# Patient Record
Sex: Female | Born: 1965 | Race: White | Hispanic: No | Marital: Married | Smoking: Never smoker
Health system: Southern US, Community
[De-identification: ages and names within clinical notes are randomized; demographics above are authoritative.]

## PROBLEM LIST (undated history)

## (undated) DIAGNOSIS — E669 Obesity, unspecified: Secondary | ICD-10-CM

## (undated) DIAGNOSIS — R011 Cardiac murmur, unspecified: Secondary | ICD-10-CM

## (undated) DIAGNOSIS — E785 Hyperlipidemia, unspecified: Secondary | ICD-10-CM

## (undated) DIAGNOSIS — R519 Headache, unspecified: Secondary | ICD-10-CM

## (undated) DIAGNOSIS — N819 Female genital prolapse, unspecified: Secondary | ICD-10-CM

## (undated) DIAGNOSIS — E119 Type 2 diabetes mellitus without complications: Secondary | ICD-10-CM

## (undated) DIAGNOSIS — R51 Headache: Secondary | ICD-10-CM

## (undated) DIAGNOSIS — I10 Essential (primary) hypertension: Secondary | ICD-10-CM

## (undated) DIAGNOSIS — K429 Umbilical hernia without obstruction or gangrene: Secondary | ICD-10-CM

## (undated) HISTORY — DX: Hyperlipidemia, unspecified: E78.5

## (undated) HISTORY — DX: Cardiac murmur, unspecified: R01.1

## (undated) HISTORY — DX: Type 2 diabetes mellitus without complications: E11.9

## (undated) HISTORY — DX: Obesity, unspecified: E66.9

## (undated) HISTORY — PX: CHOLECYSTECTOMY: SHX55

## (undated) HISTORY — DX: Female genital prolapse, unspecified: N81.9

---

## 1997-04-21 ENCOUNTER — Encounter: Admission: RE | Admit: 1997-04-21 | Discharge: 1997-07-20 | Payer: Self-pay | Admitting: Obstetrics and Gynecology

## 1998-02-26 ENCOUNTER — Emergency Department (HOSPITAL_COMMUNITY): Admission: EM | Admit: 1998-02-26 | Discharge: 1998-02-26 | Payer: Self-pay | Admitting: Emergency Medicine

## 1998-02-26 ENCOUNTER — Encounter: Payer: Self-pay | Admitting: Emergency Medicine

## 1998-11-20 ENCOUNTER — Other Ambulatory Visit: Admission: RE | Admit: 1998-11-20 | Discharge: 1998-11-20 | Payer: Self-pay | Admitting: Obstetrics and Gynecology

## 1999-02-18 ENCOUNTER — Ambulatory Visit (HOSPITAL_COMMUNITY): Admission: RE | Admit: 1999-02-18 | Discharge: 1999-02-18 | Payer: Self-pay | Admitting: *Deleted

## 1999-02-18 ENCOUNTER — Encounter: Payer: Self-pay | Admitting: *Deleted

## 1999-03-11 ENCOUNTER — Encounter: Admission: RE | Admit: 1999-03-11 | Discharge: 1999-05-10 | Payer: Self-pay | Admitting: Obstetrics and Gynecology

## 1999-03-19 ENCOUNTER — Encounter: Payer: Self-pay | Admitting: Obstetrics and Gynecology

## 1999-03-19 ENCOUNTER — Ambulatory Visit (HOSPITAL_COMMUNITY): Admission: RE | Admit: 1999-03-19 | Discharge: 1999-03-19 | Payer: Self-pay | Admitting: Obstetrics and Gynecology

## 1999-05-03 ENCOUNTER — Inpatient Hospital Stay (HOSPITAL_COMMUNITY): Admission: AD | Admit: 1999-05-03 | Discharge: 1999-05-06 | Payer: Self-pay | Admitting: Obstetrics and Gynecology

## 1999-05-03 ENCOUNTER — Ambulatory Visit (HOSPITAL_COMMUNITY): Admission: RE | Admit: 1999-05-03 | Discharge: 1999-05-03 | Payer: Self-pay | Admitting: Obstetrics and Gynecology

## 1999-05-07 ENCOUNTER — Encounter: Admission: RE | Admit: 1999-05-07 | Discharge: 1999-06-07 | Payer: Self-pay | Admitting: Obstetrics and Gynecology

## 1999-06-09 ENCOUNTER — Other Ambulatory Visit: Admission: RE | Admit: 1999-06-09 | Discharge: 1999-06-09 | Payer: Self-pay | Admitting: Obstetrics and Gynecology

## 1999-08-06 ENCOUNTER — Encounter: Payer: Self-pay | Admitting: Emergency Medicine

## 1999-08-06 ENCOUNTER — Inpatient Hospital Stay (HOSPITAL_COMMUNITY): Admission: EM | Admit: 1999-08-06 | Discharge: 1999-08-07 | Payer: Self-pay | Admitting: Emergency Medicine

## 1999-08-06 ENCOUNTER — Encounter: Payer: Self-pay | Admitting: Surgery

## 2000-12-12 ENCOUNTER — Other Ambulatory Visit: Admission: RE | Admit: 2000-12-12 | Discharge: 2000-12-12 | Payer: Self-pay | Admitting: Obstetrics and Gynecology

## 2002-01-22 ENCOUNTER — Other Ambulatory Visit: Admission: RE | Admit: 2002-01-22 | Discharge: 2002-01-22 | Payer: Self-pay | Admitting: Obstetrics and Gynecology

## 2002-06-17 ENCOUNTER — Encounter: Payer: Self-pay | Admitting: Obstetrics and Gynecology

## 2002-06-17 ENCOUNTER — Inpatient Hospital Stay (HOSPITAL_COMMUNITY): Admission: AD | Admit: 2002-06-17 | Discharge: 2002-06-17 | Payer: Self-pay | Admitting: Obstetrics and Gynecology

## 2002-06-24 ENCOUNTER — Inpatient Hospital Stay (HOSPITAL_COMMUNITY): Admission: AD | Admit: 2002-06-24 | Discharge: 2002-06-27 | Payer: Self-pay | Admitting: Obstetrics and Gynecology

## 2002-08-06 ENCOUNTER — Other Ambulatory Visit: Admission: RE | Admit: 2002-08-06 | Discharge: 2002-08-06 | Payer: Self-pay | Admitting: Obstetrics and Gynecology

## 2013-09-19 ENCOUNTER — Encounter: Payer: BC Managed Care – PPO | Attending: Internal Medicine

## 2013-09-19 VITALS — Ht 69.5 in | Wt 225.0 lb

## 2013-09-19 DIAGNOSIS — E119 Type 2 diabetes mellitus without complications: Secondary | ICD-10-CM | POA: Insufficient documentation

## 2013-09-19 DIAGNOSIS — Z713 Dietary counseling and surveillance: Secondary | ICD-10-CM | POA: Insufficient documentation

## 2013-09-20 NOTE — Progress Notes (Signed)
Patient was seen on 09/19/13 for the first of a series of three diabetes self-management courses at the [facility].  Current HbA1c: 11.6%  The following learning objectives were met by the patient during this class:  Describe diabetes  State some common risk factors for diabetes  Defines the role of glucose and insulin  Identifies type of diabetes and pathophysiology  Describe the relationship between diabetes and cardiovascular risk  State the members of the Healthcare Team  States the rationale for glucose monitoring  State when to test glucose  State their individual Target Range  State the importance of logging glucose readings  Describe how to interpret glucose readings  Identifies A1C target  Explain the correlation between A1c and eAG values  State symptoms and treatment of high blood glucose  State symptoms and treatment of low blood glucose  Explain proper technique for glucose testing  Identifies proper sharps disposal  Handouts given during class include:  Living Well with Diabetes book  Carb Counting and Meal Planning book  Meal Plan Card  Carbohydrate guide  Meal planning worksheet  Low Sodium Flavoring Tips  The diabetes portion plate  H7G to eAG Conversion Chart  Diabetes Medications  Diabetes Recommended Care Schedule  Support Group  Diabetes Success Plan  Core Class Satisfaction Survey  Follow-Up Plan:  Attend core 2

## 2013-09-26 DIAGNOSIS — E119 Type 2 diabetes mellitus without complications: Secondary | ICD-10-CM

## 2013-09-26 NOTE — Progress Notes (Signed)

## 2013-10-03 DIAGNOSIS — E119 Type 2 diabetes mellitus without complications: Secondary | ICD-10-CM | POA: Diagnosis not present

## 2013-10-03 NOTE — Progress Notes (Signed)
Patient was seen on 10/03/13 for the third of a series of three diabetes self-management courses at the [facility]. The following learning objectives were met by the patient during this class:    State the amount of activity recommended for healthy living   Describe activities suitable for individual needs   Identify ways to regularly incorporate activity into daily life   Identify barriers to activity and ways to over come these barriers  Identify diabetes medications being personally used and their primary action for lowering glucose and possible side effects   Describe role of stress on blood glucose and develop strategies to address psychosocial issues   Identify diabetes complications and ways to prevent them  Explain how to manage diabetes during illness   Evaluate success in meeting personal goal   Establish 2-3 goals that they will plan to diligently work on until they return for the  79-monthfollow-up visit  Goals:  Follow Diabetes Meal Plan as instructed  Aim for 15-30 mins of physical activity daily as tolerated  Bring food record and glucose log to your follow up visit  Your patient has established the following 4 month goals in their individualized success plan: I will count my carb choices at most meals and snacks  I will reduce my fat intake to reduce risk of heart disease  Your patient has identified these potential barriers to change:  Motivation  Your patient has identified their diabetes self-care support plan as  [facility]  Family Support  Plan:  Attend Core 4 in 4 months

## 2013-10-07 ENCOUNTER — Other Ambulatory Visit (HOSPITAL_COMMUNITY)
Admission: RE | Admit: 2013-10-07 | Discharge: 2013-10-07 | Disposition: A | Discharge status: Home / Self Care | Payer: BC Managed Care – PPO | Source: Ambulatory Visit | Attending: Internal Medicine | Admitting: Internal Medicine

## 2013-10-07 ENCOUNTER — Other Ambulatory Visit: Payer: Self-pay | Admitting: Internal Medicine

## 2013-10-07 DIAGNOSIS — Z1151 Encounter for screening for human papillomavirus (HPV): Secondary | ICD-10-CM | POA: Insufficient documentation

## 2013-10-07 DIAGNOSIS — Z01419 Encounter for gynecological examination (general) (routine) without abnormal findings: Secondary | ICD-10-CM | POA: Insufficient documentation

## 2013-10-09 LAB — CYTOLOGY - PAP

## 2014-01-20 ENCOUNTER — Encounter: Payer: BC Managed Care – PPO | Attending: Internal Medicine

## 2014-01-20 DIAGNOSIS — E119 Type 2 diabetes mellitus without complications: Secondary | ICD-10-CM | POA: Diagnosis present

## 2014-01-20 DIAGNOSIS — Z713 Dietary counseling and surveillance: Secondary | ICD-10-CM | POA: Insufficient documentation

## 2014-01-20 NOTE — Progress Notes (Signed)
Appt start time: 0900 end time:  1000.  Patient was seen on 01/20/14 for a review of the series of three diabetes self-management courses at the [facility]. The following learning objectives were met by the patient during this class:  . Reviewed blood glucose monitoring and interpretation including the recommended target ranges and Hgb A1c.  . Reviewed on carb counting, importance of regularly scheduled meals/snacks, and meal planning.  . Reviewed the effects of physical activity on glucose levels and long-term glucose control.  Recommended goal of 150 minutes of physical activity/week. . Reviewed patient medications and discussed role of medication on blood glucose and possible side effects. . Discussed strategies to manage stress, psychosocial issues, and other obstacles to diabetes management. . Encouraged moderate weight reduction to improve glucose levels.   . Reviewed short-term complications: hyper- and hypo-glycemia.  Discussed causes, symptoms, and treatment options. . Reviewed prevention, detection, and treatment of long-term complications.  Discussed the role of prolonged elevated glucose levels on body systems.  Goals:  Follow Diabetes Meal Plan as instructed  Eat 3 meals and 2 snacks, every 3-5 hrs  Limit carbohydrate intake to 45 grams carbohydrate/meal Limit carbohydrate intake to 15 grams carbohydrate/snack Add lean protein foods to meals/snacks  Monitor glucose levels as instructed by your doctor  Aim for goal of 15-30 mins of physical activity daily as tolerated  Bring food record and glucose log to your next nutrition visit

## 2014-08-04 ENCOUNTER — Ambulatory Visit (HOSPITAL_COMMUNITY): Payer: BLUE CROSS/BLUE SHIELD | Attending: Internal Medicine

## 2014-08-04 ENCOUNTER — Other Ambulatory Visit (HOSPITAL_COMMUNITY): Payer: Self-pay | Admitting: Internal Medicine

## 2014-08-04 ENCOUNTER — Other Ambulatory Visit: Payer: Self-pay

## 2014-08-04 DIAGNOSIS — R011 Cardiac murmur, unspecified: Secondary | ICD-10-CM

## 2014-12-17 ENCOUNTER — Other Ambulatory Visit: Payer: Self-pay

## 2014-12-17 ENCOUNTER — Emergency Department (HOSPITAL_COMMUNITY)
Admission: EM | Admit: 2014-12-17 | Discharge: 2014-12-17 | Disposition: A | Discharge status: Home / Self Care | Payer: BLUE CROSS/BLUE SHIELD | Attending: Emergency Medicine | Admitting: Emergency Medicine

## 2014-12-17 ENCOUNTER — Encounter (HOSPITAL_COMMUNITY): Payer: Self-pay | Admitting: *Deleted

## 2014-12-17 DIAGNOSIS — Y9289 Other specified places as the place of occurrence of the external cause: Secondary | ICD-10-CM | POA: Insufficient documentation

## 2014-12-17 DIAGNOSIS — Z91040 Latex allergy status: Secondary | ICD-10-CM | POA: Diagnosis not present

## 2014-12-17 DIAGNOSIS — Y9389 Activity, other specified: Secondary | ICD-10-CM | POA: Diagnosis not present

## 2014-12-17 DIAGNOSIS — E119 Type 2 diabetes mellitus without complications: Secondary | ICD-10-CM | POA: Insufficient documentation

## 2014-12-17 DIAGNOSIS — I10 Essential (primary) hypertension: Secondary | ICD-10-CM | POA: Insufficient documentation

## 2014-12-17 DIAGNOSIS — Y998 Other external cause status: Secondary | ICD-10-CM | POA: Diagnosis not present

## 2014-12-17 DIAGNOSIS — Z79899 Other long term (current) drug therapy: Secondary | ICD-10-CM | POA: Diagnosis not present

## 2014-12-17 DIAGNOSIS — W260XXA Contact with knife, initial encounter: Secondary | ICD-10-CM | POA: Insufficient documentation

## 2014-12-17 DIAGNOSIS — Z1231 Encounter for screening mammogram for malignant neoplasm of breast: Secondary | ICD-10-CM

## 2014-12-17 DIAGNOSIS — E669 Obesity, unspecified: Secondary | ICD-10-CM | POA: Insufficient documentation

## 2014-12-17 DIAGNOSIS — S61012A Laceration without foreign body of left thumb without damage to nail, initial encounter: Secondary | ICD-10-CM | POA: Diagnosis present

## 2014-12-17 HISTORY — DX: Essential (primary) hypertension: I10

## 2014-12-17 MED ORDER — LIDOCAINE HCL (PF) 1 % IJ SOLN
30.0000 mL | Freq: Once | INTRAMUSCULAR | Status: AC
  Administered 2014-12-17: 30 mL
  Filled 2014-12-17: qty 30

## 2014-12-17 MED ORDER — CEPHALEXIN 500 MG PO CAPS: 500.0000 mg | ORAL_CAPSULE | Freq: Four times a day (QID) | ORAL | Status: DC

## 2014-12-17 MED ORDER — TETANUS-DIPHTH-ACELL PERTUSSIS 5-2.5-18.5 LF-MCG/0.5 IM SUSP
0.5000 mL | Freq: Once | INTRAMUSCULAR | Status: DC
  Filled 2014-12-17: qty 0.5

## 2014-12-17 NOTE — ED Provider Notes (Signed)
CSN: [id number]     Arrival date & time 12/17/14  5726 History  By signing my name below, I, Evelene Croon, attest that this documentation has been prepared under the direction and in the presence of non-physician practitioner, Abigail Butts, PA-C. Electronically Signed: Evelene Croon, Scribe. 12/17/2014. 11:45 AM.   Chief Complaint  Patient presents with  . Finger Injury    The history is provided by the patient and medical records. No language interpreter was used.    HPI Comments:  Kristine Callahan is a 49 y.o. female with a HX of NIDDM presents to the Emergency Department complaining of a laceration to her left thumb, sustained ~ 0830 this AM. She reports associated throbbing pain to the thumb. Pt states she cut off the tip of her thumb with a henckel knife while cutting vegetables. Bleeding controlled with pressure. No associated symptoms noted. Her tetanus is UTD within the last year.   Past Medical History  Diagnosis Date  . Diabetes mellitus without complication   . Obesity   . Hypertension    Past Surgical History  Procedure Laterality Date  . Cholecystectomy     History reviewed. No pertinent family history. Social History  Substance Use Topics  . Smoking status: Never Smoker   . Smokeless tobacco: None  . Alcohol Use: No   OB History    No data available     Review of Systems  Constitutional: Negative for fever and chills.  Gastrointestinal: Negative for nausea and vomiting.  Skin: Positive for wound (Left Thumb).  Allergic/Immunologic: Negative for immunocompromised state.  Neurological: Negative for weakness and numbness.  Hematological: Does not bruise/bleed easily.  Psychiatric/Behavioral: The patient is not nervous/anxious.     Allergies  Latex  Home Medications   Prior to Admission medications   Medication Sig Start Date End Date Taking? Authorizing Provider  cephALEXin (KEFLEX) 500 MG capsule Take 1 capsule (500 mg total) by mouth 4 (four) times  daily. 12/17/14   Merin Borjon, PA-C  sitaGLIPtin-metformin (JANUMET) 50-1000 MG per tablet Take 1 tablet by mouth 2 (two) times daily with a meal.    Historical Provider, MD   BP 162/92 mmHg  Pulse 86  Temp(Src) 98.5 F (36.9 C) (Oral)  Resp 17  Ht 5\' 10"  (1.778 m)  Wt 235 lb (106.595 kg)  BMI 33.72 kg/m2  SpO2 98% Physical Exam  Constitutional: She is oriented to person, place, and time. She appears well-developed and well-nourished. No distress.  HENT:  Head: Normocephalic and atraumatic.  Eyes: Conjunctivae are normal. No scleral icterus.  Neck: Normal range of motion.  Cardiovascular: Normal rate, regular rhythm, normal heart sounds and intact distal pulses.   No murmur heard. Capillary refill < 3 sec  Pulmonary/Chest: Effort normal and breath sounds normal. No respiratory distress.  Musculoskeletal: Normal range of motion. She exhibits no edema.  FROM: left thumb   Neurological: She is alert and oriented to person, place, and time.  Sensation: intact Strength: 5/5 with flexion/extenstion of the right thumb  Skin: Skin is warm and dry. She is not diaphoretic.  3cm avulsion to the tip of the left thumb   Psychiatric: She has a normal mood and affect.  Nursing note and vitals reviewed.   ED Course  Procedures   DIAGNOSTIC STUDIES:  Oxygen Saturation is 98% on RA, normal by my interpretation.    COORDINATION OF CARE:  11:26 AM Discussed treatment plan with pt at bedside and pt agreed to plan.  LACERATION REPAIR PROCEDURE NOTE  The patient's identification was confirmed and consent was obtained. This procedure was performed by Abigail Butts, PA-C, at 12:31 PM. Site: left thumb Sterile procedures observed Anesthetic used (type and amt): 6cc of 1% lidocaine without epi as a digital block Suture type/size: 5-0prolene Length:3cm # of Sutures: 5 Technique: simple ionterrupted Complex Tetanus UTD  Site anesthetized, irrigated with NS, explored without  evidence of foreign body, wound well approximated, site covered with dry, sterile dressing.  Patient tolerated procedure well without complications. Instructions for care discussed verbally and patient provided with additional written instructions for homecare and f/u.   Labs Review Labs Reviewed - No data to display  Imaging Review No results found. I have personally reviewed and evaluated these images and lab results as part of my medical decision-making.   EKG Interpretation None      MDM   Final diagnoses:  Thumb laceration, left, initial encounter   Kristine Callahan presents with laceration to the left thumb.  Pressure irrigation performed. Wound explored and base of wound visualized in a bloodless field without evidence of foreign body.  Laceration occurred < 8 hours prior to repair which was well tolerated. Tdap UTD.  Pt is a non-insulin-dependent diabetic. Pt discharged with antibiotics.  Discussed suture home care with patient and answered questions. Pt to follow-up for wound check and suture removal in 7 days; they are to return to the ED sooner for signs of infection. Pt is hemodynamically stable with no complaints prior to dc.   BP 162/92 mmHg  Pulse 86  Temp(Src) 98.5 F (36.9 C) (Oral)  Resp 17  Ht 5\' 10"  (1.778 m)  Wt 235 lb (106.595 kg)  BMI 33.72 kg/m2  SpO2 98%   I personally performed the services described in this documentation, which was scribed in my presence. The recorded information has been reviewed and is accurate.   Jarrett Soho Laria Grimmett, PA-C 12/17/14 [place], MD 12/17/14 [phone]

## 2014-12-17 NOTE — ED Notes (Signed)
Declined W/C at D/C and was escorted to lobby by RN. 

## 2014-12-17 NOTE — ED Notes (Signed)
Pt reports cutting left thumb with knife this am 0815, bleeding controlled.

## 2014-12-17 NOTE — Discharge Instructions (Signed)

## 2015-01-09 ENCOUNTER — Ambulatory Visit: Payer: BLUE CROSS/BLUE SHIELD

## 2015-02-04 ENCOUNTER — Ambulatory Visit
Admission: RE | Admit: 2015-02-04 | Discharge: 2015-02-04 | Disposition: A | Discharge status: Home / Self Care | Payer: BLUE CROSS/BLUE SHIELD | Source: Ambulatory Visit

## 2015-02-04 DIAGNOSIS — Z1231 Encounter for screening mammogram for malignant neoplasm of breast: Secondary | ICD-10-CM

## 2015-02-09 ENCOUNTER — Other Ambulatory Visit: Payer: Self-pay | Admitting: Internal Medicine

## 2015-02-09 DIAGNOSIS — R928 Other abnormal and inconclusive findings on diagnostic imaging of breast: Secondary | ICD-10-CM

## 2015-02-18 ENCOUNTER — Ambulatory Visit
Admission: RE | Admit: 2015-02-18 | Discharge: 2015-02-18 | Disposition: A | Discharge status: Home / Self Care | Payer: BLUE CROSS/BLUE SHIELD | Source: Ambulatory Visit | Attending: Internal Medicine | Admitting: Internal Medicine

## 2015-02-18 DIAGNOSIS — R928 Other abnormal and inconclusive findings on diagnostic imaging of breast: Secondary | ICD-10-CM

## 2016-02-24 ENCOUNTER — Other Ambulatory Visit: Payer: Self-pay | Admitting: Gastroenterology

## 2016-05-27 ENCOUNTER — Encounter (HOSPITAL_COMMUNITY): Payer: Self-pay | Admitting: *Deleted

## 2016-05-31 ENCOUNTER — Ambulatory Visit (HOSPITAL_COMMUNITY): Payer: BLUE CROSS/BLUE SHIELD | Admitting: Anesthesiology

## 2016-05-31 ENCOUNTER — Ambulatory Visit (HOSPITAL_COMMUNITY)
Admission: RE | Admit: 2016-05-31 | Discharge: 2016-05-31 | Disposition: A | Discharge status: Home / Self Care | Payer: BLUE CROSS/BLUE SHIELD | Source: Ambulatory Visit | Attending: Gastroenterology | Admitting: Gastroenterology

## 2016-05-31 ENCOUNTER — Encounter (HOSPITAL_COMMUNITY): Payer: Self-pay | Admitting: *Deleted

## 2016-05-31 ENCOUNTER — Encounter (HOSPITAL_COMMUNITY)
Admission: RE | Disposition: A | Discharge status: Home / Self Care | Payer: Self-pay | Source: Ambulatory Visit | Attending: Gastroenterology

## 2016-05-31 DIAGNOSIS — E1140 Type 2 diabetes mellitus with diabetic neuropathy, unspecified: Secondary | ICD-10-CM | POA: Insufficient documentation

## 2016-05-31 DIAGNOSIS — Z1211 Encounter for screening for malignant neoplasm of colon: Secondary | ICD-10-CM | POA: Insufficient documentation

## 2016-05-31 DIAGNOSIS — I10 Essential (primary) hypertension: Secondary | ICD-10-CM | POA: Diagnosis not present

## 2016-05-31 HISTORY — DX: Umbilical hernia without obstruction or gangrene: K42.9

## 2016-05-31 HISTORY — DX: Headache: R51

## 2016-05-31 HISTORY — DX: Headache, unspecified: R51.9

## 2016-05-31 HISTORY — PX: COLONOSCOPY WITH PROPOFOL: SHX5780

## 2016-05-31 LAB — GLUCOSE, CAPILLARY: Glucose-Capillary: 198 mg/dL — ABNORMAL HIGH (ref 65–99)

## 2016-05-31 SURGERY — COLONOSCOPY WITH PROPOFOL
Anesthesia: Monitor Anesthesia Care

## 2016-05-31 MED ORDER — LACTATED RINGERS IV SOLN
INTRAVENOUS | Status: DC
  Administered 2016-05-31: 1000 mL via INTRAVENOUS

## 2016-05-31 MED ORDER — PROPOFOL 10 MG/ML IV BOLUS
INTRAVENOUS | Status: AC
  Filled 2016-05-31: qty 20

## 2016-05-31 MED ORDER — PROPOFOL 10 MG/ML IV BOLUS
INTRAVENOUS | Status: DC | PRN
  Administered 2016-05-31: 50 mg via INTRAVENOUS

## 2016-05-31 MED ORDER — PROPOFOL 10 MG/ML IV BOLUS
INTRAVENOUS | Status: AC
  Filled 2016-05-31: qty 40

## 2016-05-31 MED ORDER — LIDOCAINE 2% (20 MG/ML) 5 ML SYRINGE
INTRAMUSCULAR | Status: DC | PRN
  Administered 2016-05-31: 100 mg via INTRAVENOUS

## 2016-05-31 MED ORDER — LIDOCAINE 2% (20 MG/ML) 5 ML SYRINGE
INTRAMUSCULAR | Status: AC
  Filled 2016-05-31: qty 5

## 2016-05-31 MED ORDER — PROPOFOL 500 MG/50ML IV EMUL
INTRAVENOUS | Status: DC | PRN
  Administered 2016-05-31: 150 ug/kg/min via INTRAVENOUS

## 2016-05-31 MED ORDER — SODIUM CHLORIDE 0.9 % IV SOLN: INTRAVENOUS | Status: DC

## 2016-05-31 SURGICAL SUPPLY — 21 items

## 2016-05-31 NOTE — Anesthesia Postprocedure Evaluation (Signed)
Anesthesia Post Note  Patient: [name]  Procedure(s) Performed: Procedure(s) (LRB): COLONOSCOPY WITH PROPOFOL (N/A)  Patient location during evaluation: PACU Anesthesia Type: MAC Level of consciousness: awake and alert Pain management: pain level controlled Vital Signs Assessment: post-procedure vital signs reviewed and stable Respiratory status: spontaneous breathing, nonlabored ventilation, respiratory function stable and patient connected to nasal cannula oxygen Cardiovascular status: stable and blood pressure returned to baseline Anesthetic complications: no       Last Vitals:  Vitals:   05/31/16 0930  BP: (!) 123/47  Pulse: 77  Resp: 12  Temp: 36.5 C    Last Pain:  Vitals:   05/31/16 0930  TempSrc: Oral                 Leno Mathes EDWARD

## 2016-05-31 NOTE — Anesthesia Preprocedure Evaluation (Signed)
Anesthesia Evaluation  Patient identified by MRN, date of birth, ID band Patient awake    Reviewed: Allergy & Precautions, H&P , Patient's Chart, lab work & pertinent test results, reviewed documented beta blocker date and time   Airway Mallampati: II  TM Distance: >3 FB Neck ROM: full    Dental no notable dental hx.    Pulmonary    Pulmonary exam normal breath sounds clear to auscultation       Cardiovascular hypertension,  Rhythm:regular Rate:Normal     Neuro/Psych    GI/Hepatic   Endo/Other  diabetes  Renal/GU      Musculoskeletal   Abdominal   Peds  Hematology   Anesthesia Other Findings Obesity    Hypertension   Diabetes mellitus  type 2       Reproductive/Obstetrics                             Anesthesia Physical Anesthesia Plan  ASA: II  Anesthesia Plan: MAC   Post-op Pain Management:    Induction: Intravenous  Airway Management Planned: Mask and Natural Airway  Additional Equipment:   Intra-op Plan:   Post-operative Plan:   Informed Consent: I have reviewed the patients History and Physical, chart, labs and discussed the procedure including the risks, benefits and alternatives for the proposed anesthesia with the patient or authorized representative who has indicated his/her understanding and acceptance.   Dental Advisory Given  Plan Discussed with: CRNA and Surgeon  Anesthesia Plan Comments: (Discussed sedation and potential to need to place airway or ETT if warranted by clinical changes intra-operatively. We will start procedure as MAC.)        Anesthesia Quick Evaluation

## 2016-05-31 NOTE — Transfer of Care (Signed)
Immediate Anesthesia Transfer of Care Note  Patient: Kristine Callahan  Procedure(s) Performed: Procedure(s): COLONOSCOPY WITH PROPOFOL (N/A)  Patient Location: PACU  Anesthesia Type:MAC  Level of Consciousness: awake, alert  and oriented  Airway & Oxygen Therapy: Patient Spontanous Breathing and Patient connected to face mask oxygen  Post-op Assessment: Report given to RN and Post -op Vital signs reviewed and stable  Post vital signs: Reviewed and stable  Last Vitals:  Vitals:   05/31/16 0930  BP: (!) 123/47  Pulse: 77  Resp: 12  Temp: 36.5 C    Last Pain:  Vitals:   05/31/16 0930  TempSrc: Oral         Complications: No apparent anesthesia complications

## 2016-05-31 NOTE — Op Note (Signed)
[facility] Patient Name: Kristine Callahan Procedure Date: 05/31/2016 MRN: [id number] Attending MD: Garlan Fair , MD Date of Birth: [birth year] CSN: [id number] Age: 51 Admit Type: Outpatient Procedure:                Colonoscopy Indications:              Screening for colorectal malignant neoplasm Providers:                Garlan Fair, MD, Hilma Favors, RN, Elspeth Cho Tech., Technician, [facility],                            CRNA Referring MD:              Medicines:                Propofol per Anesthesia Complications:            No immediate complications. Estimated Blood Loss:     Estimated blood loss: none. Procedure:                Pre-Anesthesia Assessment:                           - Prior to the procedure, a History and Physical                            was performed, and patient medications and                            allergies were reviewed. The patient's tolerance of                            previous anesthesia was also reviewed. The risks                            and benefits of the procedure and the sedation                            options and risks were discussed with the patient.                            All questions were answered, and informed consent                            was obtained. Prior Anticoagulants: The patient has                            taken aspirin, last dose was day of procedure. ASA                            Grade Assessment: II - A patient with mild systemic  disease. After reviewing the risks and benefits,                            the patient was deemed in satisfactory condition to                            undergo the procedure.                           After obtaining informed consent, the colonoscope                            was passed under direct vision. Throughout the                            procedure, the patient's blood  pressure, pulse, and                            oxygen saturations were monitored continuously. The                            EC-[id number] ([id number]) scope was introduced through                            the anus and advanced to the the cecum, identified                            by appendiceal orifice and ileocecal valve. The                            colonoscopy was performed without difficulty. The                            patient tolerated the procedure well. The quality                            of the bowel preparation was good. The terminal                            ileum, the ileocecal valve, the appendiceal orifice                            and the rectum were photographed. Scope In: 8:56:46 AM Scope Out: 9:24:55 AM Scope Withdrawal Time: 0 hours 18 minutes 0 seconds  Total Procedure Duration: 0 hours 28 minutes 9 seconds  Findings:      The perianal and digital rectal examinations were normal.      The entire examined colon appeared normal. Universal colonic       diverticulosis was present. Impression:               - The entire examined colon is normal.                           - No specimens collected. Moderate Sedation:  N/A- Per Anesthesia Care Recommendation:           - Patient has a contact number available for                            emergencies. The signs and symptoms of potential                            delayed complications were discussed with the                            patient. Return to normal activities tomorrow.                            Written discharge instructions were provided to the                            patient.                           - Repeat colonoscopy in 10 years for screening                            purposes.                           - Resume previous diet.                           - Continue present medications. Procedure Code(s):        --- Professional ---                           G0174, Colorectal cancer  screening; colonoscopy on                            individual not meeting criteria for high risk Diagnosis Code(s):        --- Professional ---                           Z12.11, Encounter for screening for malignant                            neoplasm of colon CPT copyright 2016 [employer]. All rights reserved. The codes documented in this report are preliminary and upon coder review may  be revised to meet current compliance requirements. Earle Gell, MD Garlan Fair, MD 05/31/2016 9:30:49 AM This report has been signed electronically. Number of Addenda: 0

## 2016-05-31 NOTE — H&P (Signed)
Procedure: Baseline screening colonoscopy  History: The patient is a 51 year old female born 06-23-1965. She is scheduled to undergo her first screening colonoscopy with polypectomy to prevent colon cancer.  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 Cholecystectomy. Hypertension.  Family history is unknown. Patient was adopted.  Allergies: Latex  Exam: The patient is alert and lying comfortably on the endoscopy stretcher. Abdomen is soft and nontender to palpation. Lungs are clear to auscultation. Cardiac exam reveals a regular rhythm.  Plan: Proceed with baseline screening colonoscopy

## 2016-05-31 NOTE — Discharge Instructions (Signed)

## 2016-06-01 ENCOUNTER — Encounter (HOSPITAL_COMMUNITY): Payer: Self-pay | Admitting: Gastroenterology

## 2016-09-30 NOTE — Anesthesia Postprocedure Evaluation (Signed)
Anesthesia Post Note  Patient: Kristine Callahan  Procedure(s) Performed: Procedure(s) (LRB): COLONOSCOPY WITH PROPOFOL (N/A)     Anesthesia Post Evaluation  Last Vitals:  Vitals:   05/31/16 0930 05/31/16 1000  BP: (!) 123/47 (!) 145/70  Pulse: 77   Resp: 12   Temp: 36.5 C     Last Pain:  Vitals:   05/31/16 0930  TempSrc: Oral                 Manya Balash EDWARD

## 2016-09-30 NOTE — Addendum Note (Signed)
Addendum  created 09/30/16 1446 by Lyndle Herrlich, MD   Sign clinical note

## 2017-01-18 ENCOUNTER — Other Ambulatory Visit (HOSPITAL_COMMUNITY)
Admission: RE | Admit: 2017-01-18 | Discharge: 2017-01-18 | Disposition: A | Discharge status: Home / Self Care | Payer: BLUE CROSS/BLUE SHIELD | Source: Ambulatory Visit | Attending: Internal Medicine | Admitting: Internal Medicine

## 2017-01-18 ENCOUNTER — Other Ambulatory Visit: Payer: Self-pay | Admitting: Internal Medicine

## 2017-01-18 DIAGNOSIS — Z1231 Encounter for screening mammogram for malignant neoplasm of breast: Secondary | ICD-10-CM

## 2017-01-18 DIAGNOSIS — Z124 Encounter for screening for malignant neoplasm of cervix: Secondary | ICD-10-CM | POA: Insufficient documentation

## 2017-01-23 LAB — CYTOLOGY - PAP
Chlamydia: NEGATIVE
Diagnosis: NEGATIVE
Neisseria Gonorrhea: NEGATIVE

## 2017-02-15 ENCOUNTER — Ambulatory Visit
Admission: RE | Admit: 2017-02-15 | Discharge: 2017-02-15 | Disposition: A | Discharge status: Home / Self Care | Payer: BLUE CROSS/BLUE SHIELD | Source: Ambulatory Visit | Attending: Internal Medicine | Admitting: Internal Medicine

## 2017-02-15 DIAGNOSIS — Z1231 Encounter for screening mammogram for malignant neoplasm of breast: Secondary | ICD-10-CM

## 2019-10-21 ENCOUNTER — Ambulatory Visit: Payer: BLUE CROSS/BLUE SHIELD | Admitting: Endocrinology

## 2019-11-27 ENCOUNTER — Other Ambulatory Visit: Payer: Self-pay | Admitting: Internal Medicine

## 2019-11-27 DIAGNOSIS — Z1231 Encounter for screening mammogram for malignant neoplasm of breast: Secondary | ICD-10-CM

## 2019-11-27 NOTE — Progress Notes (Signed)
Name: Kristine Callahan  MRN/ DOB: [id number], 03-14-1966   Age/ Sex: 54 y.o., female    PCP: Leeroy Cha, MD   Reason for Endocrinology Evaluation: Type 2 Diabetes Mellitus     Date of Initial Endocrinology Visit: 11/29/2019     PATIENT IDENTIFIER: Kristine Callahan is a 54 y.o. female with a past medical history of T2DM and HTN. The patient presented for initial endocrinology clinic visit on 11/29/2019 for consultative assistance with her diabetes management.    HPI: Ms. Rasool was    Diagnosed with DM years ago  Prior Medications tried/Intolerance: Glipizide.  Insulin started 08/2019 Currently checking blood sugars occasionally   Hypoglycemia episodes : no           Hemoglobin A1c has ranged from 7.1% , peaking at 11.0%  in 2016. Patient required assistance for hypoglycemia: no Patient has required hospitalization within the last 1 year from hyper or hypoglycemia:no   In terms of diet, the patient eats 3 meals a day, trying to work on decreasing snacks. 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 Diabetes mellitus without complication ([place])    type 2  . Headache    hx migraines none in years  . Hypertension   . Obesity   . Umbilical hernia    Past Surgical History:  Past  Surgical History:  Procedure Laterality Date  . CHOLECYSTECTOMY    . COLONOSCOPY WITH PROPOFOL N/A 05/31/2016   Procedure: COLONOSCOPY WITH PROPOFOL;  Surgeon: Garlan Fair, MD;  Location: [facility];  Service: Endoscopy;  Laterality: N/A;      Social History:  reports that she has never smoked. She has never used smokeless tobacco. She reports current alcohol use. She reports that she does not use drugs.  Family History: History reviewed. No pertinent family history.   HOME MEDICATIONS: Allergies as of 11/29/2019      Reactions   Latex Other (See Comments)   Taste sensation       Medication List       Accurate as of November 29, 2019  8:28 AM. If you have any questions, ask your nurse or doctor.        STOP taking these medications   glipiZIDE 5 MG 24 hr tablet Commonly known as: GLUCOTROL XL Stopped by: Dorita Sciara, MD     TAKE these medications   Accu-Chek Guide Me w/Device Kit by Does not apply route.   Accu-Chek Guide test strip Generic drug: glucose blood 1 each by Other route as needed for other. Use as instructed   aspirin 81 MG tablet Take 81 mg by mouth daily.   docusate sodium 100 MG capsule Commonly known as: COLACE Take 100 mg by mouth daily.   Farxiga 10 MG Tabs tablet Generic drug: dapagliflozin  propanediol Take 10 mg by mouth daily.   fenofibrate 160 MG tablet Take 160 mg by mouth daily.   Levemir FlexTouch 100 UNIT/ML FlexPen Generic drug: insulin detemir Inject 10 Units into the skin 2 (two) times daily.   lisinopril-hydrochlorothiazide 20-12.5 MG tablet Commonly known as: ZESTORETIC Take 1 tablet by mouth daily.   rosuvastatin 5 MG tablet Commonly known as: CRESTOR Take 5 mg by mouth daily.   sitaGLIPtin-metformin 50-1000 MG tablet Commonly known as: JANUMET Take 1 tablet by mouth 2 (two) times daily with a meal.   Victoza 18 MG/3ML Sopn Generic drug: liraglutide Inject 1.8 mg into the skin daily.         ALLERGIES: Allergies  Allergen Reactions  . Latex Other (See Comments)    Taste sensation      REVIEW OF SYSTEMS: A comprehensive ROS was conducted with the patient and is negative except as per HPI and below:  Review of Systems  Genitourinary: Negative for frequency.  Endo/Heme/Allergies: Negative for polydipsia.      OBJECTIVE:   VITAL SIGNS: BP (!) 162/84 (BP Location: Left Arm, Patient Position: Sitting, Cuff Size: Normal)   Pulse 98   Ht $R'5\' 10"'sn$  (1.778 m)   Wt 225 lb 9.6 oz (102.3 kg)   SpO2 98%   BMI 32.37 kg/m    PHYSICAL EXAM:  General: Pt appears well and is in NAD  Neck: General: Supple without adenopathy or carotid bruits. Thyroid: Thyroid size normal.  No goiter or nodules appreciated. No thyroid bruit.  Lungs: Clear with good BS bilat with no rales, rhonchi, or wheezes  Heart: RRR with normal S1 and S2 and no gallops; no murmurs; no rub  Abdomen: Normoactive bowel sounds, soft, nontender, without masses or organomegaly palpable  Extremities:  Lower extremities - No pretibial edema. No lesions.  Skin: Normal texture and temperature to palpation. No rash noted. No Acanthosis nigricans/skin tags. No lipohypertrophy.  Neuro: MS is good with appropriate affect, pt is alert and Ox3    DM foot exam: 11/29/2019 The skin of the feet is intact without sores or ulcerations. The pedal pulses are 1+ on right and 1+ on left. The sensation is decreased to a screening 5.07, 10 gram monofilament bilaterally   DATA REVIEWED:    ASSESSMENT / PLAN / RECOMMENDATIONS:   1) Type 2 Diabetes Mellitus, Sun-optimally controlled, With Neuropathic and retinopathic complications - Most recent A1c of 7.9 %. Goal A1c < 7.0 %.    Plan: GENERAL: I have discussed with the patient the pathophysiology of diabetes. We went over the natural progression of the disease. We talked about both insulin resistance and insulin deficiency. We stressed the importance of lifestyle changes  including diet and exercise. I explained the complications associated with diabetes including retinopathy, nephropathy, neuropathy as well as increased risk of cardiovascular disease. We went over the benefit seen with glycemic control.    I explained to the patient that diabetic patients are at higher than normal risk for amputations.   We discussed the importance of low carb diet ( pt is a stress eater), she already avoids sugar-sweetened beverages   MEDICATIONS: - STOP Janumet  - Start Metformin 1000 mg Twice daily  - Continue Farxiga 10 mg daily  - Continue Victoza 1.8 mg daily  - Switch Levemir to [place] 24 units daily  EDUCATION / INSTRUCTIONS:  BG monitoring instructions: Patient is instructed to check her blood sugars 2 times a day, fasting and bedtime   Call [facility] Endocrinology clinic  if: BG persistently < 70  . I reviewed the Rule of 15 for the treatment of hypoglycemia in detail with the patient. Literature supplied.   2) Diabetic complications:   Eye: Does  have known diabetic retinopathy.   Neuro/ Feet: Does  have known diabetic peripheral neuropathy.  Renal: Patient does not have known baseline CKD. She is  on an ACEI/ARB at present.   3) Dyslipidemia: Patient is on Rosuvastatin 5 mg daily . LDL above goal. Discussed cardiovascular benefits of statins, pt admits to dietary indiscretions in the past but has done better in the past 2 months.      F/U in 4 months     Signed electronically by: Mack Guise, MD  [facility]  [address] [place], [place] [address] Phone: [phone] FAX: [phone]   CC: Leeroy Cha, [place] [place] [address] Phone: [phone]  Fax: [phone]    Return to Endocrinology clinic as below: Future Appointments  Date Time Provider [place]  12/19/2019  8:30 AM [facility]

## 2019-11-29 ENCOUNTER — Other Ambulatory Visit: Payer: Self-pay

## 2019-11-29 ENCOUNTER — Ambulatory Visit (INDEPENDENT_AMBULATORY_CARE_PROVIDER_SITE_OTHER): Payer: BC Managed Care – PPO | Admitting: Internal Medicine

## 2019-11-29 ENCOUNTER — Encounter: Payer: Self-pay | Admitting: Internal Medicine

## 2019-11-29 VITALS — BP 162/84 | HR 98 | Ht 70.0 in | Wt 225.6 lb

## 2019-11-29 DIAGNOSIS — Z794 Long term (current) use of insulin: Secondary | ICD-10-CM | POA: Diagnosis not present

## 2019-11-29 DIAGNOSIS — E785 Hyperlipidemia, unspecified: Secondary | ICD-10-CM | POA: Diagnosis not present

## 2019-11-29 DIAGNOSIS — E1165 Type 2 diabetes mellitus with hyperglycemia: Secondary | ICD-10-CM

## 2019-11-29 DIAGNOSIS — E113593 Type 2 diabetes mellitus with proliferative diabetic retinopathy without macular edema, bilateral: Secondary | ICD-10-CM | POA: Diagnosis not present

## 2019-11-29 DIAGNOSIS — E1142 Type 2 diabetes mellitus with diabetic polyneuropathy: Secondary | ICD-10-CM | POA: Insufficient documentation

## 2019-11-29 HISTORY — DX: Type 2 diabetes mellitus with hyperglycemia: E11.65

## 2019-11-29 HISTORY — DX: Long term (current) use of insulin: Z79.4

## 2019-11-29 LAB — POCT GLYCOSYLATED HEMOGLOBIN (HGB A1C): Hemoglobin A1C: 7.9 % — AB (ref 4.0–5.6)

## 2019-11-29 LAB — POCT GLUCOSE (DEVICE FOR HOME USE): Glucose Fasting, POC: 186 mg/dL — AB (ref 70–99)

## 2019-11-29 MED ORDER — TRESIBA FLEXTOUCH 100 UNIT/ML ~~LOC~~ SOPN: 24.0000 [IU] | PEN_INJECTOR | Freq: Every day | SUBCUTANEOUS | 6 refills | Status: DC

## 2019-11-29 MED ORDER — METFORMIN HCL 1000 MG PO TABS: 1000.0000 mg | ORAL_TABLET | Freq: Two times a day (BID) | ORAL | 3 refills | Status: DC

## 2019-11-29 NOTE — Patient Instructions (Addendum)
-   STOP Janumet  - Start Metformin 1000 mg Twice daily  - Continue Farxiga 10 mg daily  - Continue Victoza 1.8 mg daily  - Increase Levemir to 24 units daily ( Once done with current prescription, start Tresiba 24 units daily )      Choose healthy, lower carb lower calorie snacks: toss salad, cooked vegetables, cottage cheese, peanut butter, low fat cheese / string cheese, lower sodium deli meat, tuna salad or chicken salad     HOW TO TREAT LOW BLOOD SUGARS (Blood sugar LESS THAN 70 MG/DL)  Please follow the RULE OF 15 for the treatment of hypoglycemia treatment (when your (blood sugars are less than 70 mg/dL)    STEP 1: Take 15 grams of carbohydrates when your blood sugar is low, which includes:   3-4 GLUCOSE TABS  OR  3-4 OZ OF JUICE OR REGULAR SODA OR  ONE TUBE OF GLUCOSE GEL     STEP 2: RECHECK blood sugar in 15 MINUTES STEP 3: If your blood sugar is still low at the 15 minute recheck --> then, go back to STEP 1 and treat AGAIN with another 15 grams of carbohydrates.

## 2019-12-13 ENCOUNTER — Ambulatory Visit: Payer: BC Managed Care – PPO

## 2019-12-19 ENCOUNTER — Other Ambulatory Visit: Payer: Self-pay

## 2019-12-19 ENCOUNTER — Ambulatory Visit
Admission: RE | Admit: 2019-12-19 | Discharge: 2019-12-19 | Disposition: A | Discharge status: Home / Self Care | Payer: BC Managed Care – PPO | Source: Ambulatory Visit | Attending: Internal Medicine | Admitting: Internal Medicine

## 2019-12-19 DIAGNOSIS — Z1231 Encounter for screening mammogram for malignant neoplasm of breast: Secondary | ICD-10-CM

## 2020-04-03 ENCOUNTER — Ambulatory Visit: Payer: BC Managed Care – PPO | Admitting: Internal Medicine

## 2020-07-06 ENCOUNTER — Other Ambulatory Visit: Payer: Self-pay | Admitting: Internal Medicine

## 2020-07-10 NOTE — Progress Notes (Addendum)
[facility] New Patient Evaluation and Consultation  Referring Provider: Drema Dallas, DO PCP: Leeroy Cha, MD Date of Service: 07/14/2020  SUBJECTIVE Chief Complaint: New Patient (Initial Visit) Kristine Callahan is a 55 y.o. female complains of prolapse./)  History of Present Illness: Kristine Callahan is a 55 y.o. White or Caucasian female seen in consultation at the request of Dr. Delora Fuel for evaluation of prolapse.    Review of records from Dr Delora Fuel significant for: Has been having difficulty with constipation, has to push on vagina to have a bowel movement. Noted both uterine and posterior prolapse on exam.   Urinary Symptoms: Leaks urine with cough/ sneeze, laughing, exercise, lifting, during sex, with a full bladder, with movement to the bathroom and with urgency Leaks 1 time(s) per day.  Pad use: none She is bothered by her UI symptoms.  Day time voids 6-8.  Nocturia: 1 times per night to void. Voiding dysfunction: she empties her bladder well.  does not use a catheter to empty bladder.  When urinating, she feels she has no difficulties  UTIs:  0  UTI's in the last year.   Denies history of blood in urine and kidney or bladder stones  Pelvic Organ Prolapse Symptoms:                  She Admits to a feeling of a bulge the vaginal area. It has been present for 2 years.  She Denies seeing a bulge.  This bulge is bothersome, has worsened over the last 6-8 months  Bowel Symptom: Bowel movements: 6-8 time(s) per day. Normally [place] 5, about once a week will have [place] 4 stool. Also has clear or liquid stool leak out.  Stool consistency: loose Straining: yes.  Splinting: yes.  Incomplete evacuation: yes.  She Admits to accidental bowel leakage / fecal incontinence  Occurs: hourly. Has been leaking stool for about 2 years and is very bothersome. Feels this all started after she needed an enema a few years ago.   Consistency with leakage: soft  or  liquid Bowel regimen:  not currently- was taking miralax and citrucel but did not help her symptoms.  Last colonoscopy: Date 05/2016, Results: negative- needs repeat in 10 years Bowel leakage is the most bothersome for her because is causing a rash.   Sexual Function Sexually active: no.  Sexual orientation:  heterosexual Pain with sex: Yes, at the vaginal opening, deep in the pelvis, has discomfort due to dryness  Pelvic Pain Denies pelvic pain   Past Medical History:  Past Medical History:  Diagnosis Date   Diabetes mellitus without complication ([place])    type 2   Headache    hx migraines none in years   Hypertension    Obesity    Umbilical hernia      Past Surgical History:   Past Surgical History:  Procedure Laterality Date   CHOLECYSTECTOMY     COLONOSCOPY WITH PROPOFOL N/A 05/31/2016   Procedure: COLONOSCOPY WITH PROPOFOL;  Surgeon: Garlan Fair, MD;  Location: [facility];  Service: Endoscopy;  Laterality: N/A;     Past OB/GYN History: G5 P3 Vaginal deliveries: 2,  Forceps/ Vacuum deliveries: 1, Cesarean section: 0 OB History  Gravida Para Term Preterm AB Living  5       2 3   SAB IAB Ectopic Multiple Live Births               # Outcome Date GA Lbr Len/2nd Weight Sex Delivery Anes PTL Lv  5 Gravida           4 Gravida           3 Gravida           2 AB           1 AB             Menopausal: Yes, Denies vaginal bleeding since menopause Last pap smear was 12/2016- negative.  Any history of abnormal pap smears: no.   Medications: She has a current medication list which includes the following prescription(s): aspirin, accu-chek guide me, dapagliflozin propanediol, fenofibrate, accu-chek guide, levemir flextouch, victoza, lisinopril-hydrochlorothiazide, metformin, rosuvastatin, tresiba flextouch, and docusate sodium.   Allergies: Patient is allergic to latex.   Social History:  Social History   Tobacco Use   Smoking status: Never Smoker   Smokeless  tobacco: Never Used  Scientific laboratory technician Use: Never used  Substance Use Topics   Alcohol use: Yes    Comment: very rare   Drug use: No    Relationship status: married She lives with husband.     Family History:  History reviewed. No pertinent family history.   Review of Systems: Review of Systems  Constitutional: Negative for fever, malaise/fatigue and weight loss.  Respiratory: Negative for cough, shortness of breath and wheezing.   Cardiovascular: Negative for chest pain, palpitations and leg swelling.  Gastrointestinal: Negative for abdominal pain and blood in stool.  Genitourinary: Negative for dysuria.  Musculoskeletal: Negative for myalgias.       + muscle weakness   Skin: Negative for rash.  Neurological: Negative for dizziness and headaches.  Endo/Heme/Allergies: Does not bruise/bleed easily.  Psychiatric/Behavioral: Negative for depression. The patient is not nervous/anxious.      OBJECTIVE Physical Exam: Vitals:   07/14/20 0934  BP: (!) 149/79  Pulse: 71  Weight: 227 lb (103 kg)  Height: 5\' 9"  (1.753 m)    Physical Exam Constitutional:      General: She is not in acute distress. Pulmonary:     Effort: Pulmonary effort is normal.  Abdominal:     General: There is no distension.     Palpations: Abdomen is soft.     Tenderness: There is no abdominal tenderness. There is no rebound.     Comments: Umbilical hernia noted  Musculoskeletal:        General: No swelling. Normal range of motion.  Skin:    General: Skin is warm and dry.     Findings: No rash.  Neurological:     Mental Status: She is alert and oriented to person, place, and time.  Psychiatric:        Mood and Affect: Mood normal.        Behavior: Behavior normal.     GU / Detailed Urogynecologic Evaluation:  Pelvic Exam: Normal external female genitalia; Bartholin's and Skene's glands normal in appearance; urethral meatus normal in appearance, no urethral masses or discharge.   CST:  negative  Speculum exam reveals normal vaginal mucosa with  atrophy and normal cervix.  Adnexa no mass, fullness, tenderness.    Pelvic floor strength III/V, puborectalis II/V external anal sphincter I/V  Pelvic floor musculature: Right levator non-tender, Right obturator non-tender, Left levator non-tender, Left obturator non-tender  POP-Q:   POP-Q  -2  Aa   -2                                           Ba  -5.5                                              C   4.5                                            Gh  4                                            Pb  10                                            tvl   0                                            Ap  0                                            Bp  -8                                              D     Rectal Exam:  Decreased sphincter tone, large distal rectocele, enterocoele not present, no rectal masses, no sign of dyssynergia when asking the patient to bear down.  Post-Void Residual (PVR) by Bladder Scan: In order to evaluate bladder emptying, we discussed obtaining a postvoid residual and she agreed to this procedure.  Procedure: The ultrasound unit was placed on the patient's abdomen in the suprapubic region after the patient had voided. A PVR of 16 ml was obtained by bladder scan.  Laboratory Results: POC urine: + glucose, protein  I visualized the urine specimen, noting the specimen to be clear yellow  ASSESSMENT AND PLAN Ms. Millican is a 55 y.o. with:  1. Full incontinence of feces   2. Prolapse of posterior vaginal wall   3. Stress incontinence   4. Pelvic floor weakness   5. Urinary urgency    1. Fecal incontinence/ pelvic floor muscle weakness - Treatment options include anti-diarrhea medication (loperamide/ Imodium OTC or prescription lomotil), fiber supplements, physical therapy, and possible sacral neuromodulation or surgery.   - We discussed  starting with loperamide 1-2 mg daily and titrating up as needed.  - She will start with fiber supplement with psyllium daily to help with stool bulking.  - Physical therapy referral placed to help with pelvic floor muscle weakness.  -  We also discussed that although she may have difficulty with bowel movements with the prolapse, fixing the prolapse will not necessarily fix her incontinence, especially since she has loose/ frequent stools. Since her stool changes were sudden in the last few years, we discussed referral to GI to further investigate. Referral placed to Dr Carlean Purl. In the meantime, she will try the loperamide and fiber supplementation.   2. Stage I anterior, Stage II posterior, Stage I apical prolapse For treatment of pelvic organ prolapse, we discussed options for management including expectant management, conservative management, and surgical management, such as Kegels, a pessary, pelvic floor physical therapy, and specific surgical procedures. - She is interested in surgical correction (posterior repair and perineorrhaphy). We discussed that fixing the prolapse would not necessarily fix her stool incontinence, so it is important to address this prior to the surgery.   3. SUI For treatment of stress urinary incontinence,  non-surgical options include expectant management, weight loss, physical therapy, as well as a pessary.  Surgical options include a midurethral sling, Burch urethropexy, and transurethral injection of a bulking agent. - She would be interested in surgical correction at the time of prolapse repair. Will have her undergo urodynamic testing to further evaluate.   4. Urinary urgency - rare, not bothersome at this time  Return for urodynamic testing   [name], MD   Medical Decision Making:  - Reviewed/ ordered a clinical laboratory test - Reviewed/ ordered medicine test - Review and summation of prior records

## 2020-07-14 ENCOUNTER — Encounter: Payer: Self-pay | Admitting: Obstetrics and Gynecology

## 2020-07-14 ENCOUNTER — Other Ambulatory Visit: Payer: Self-pay

## 2020-07-14 ENCOUNTER — Ambulatory Visit (INDEPENDENT_AMBULATORY_CARE_PROVIDER_SITE_OTHER): Payer: BC Managed Care – PPO | Admitting: Obstetrics and Gynecology

## 2020-07-14 VITALS — BP 149/79 | HR 71 | Ht 69.0 in | Wt 227.0 lb

## 2020-07-14 DIAGNOSIS — N393 Stress incontinence (female) (male): Secondary | ICD-10-CM | POA: Diagnosis not present

## 2020-07-14 DIAGNOSIS — N816 Rectocele: Secondary | ICD-10-CM | POA: Diagnosis not present

## 2020-07-14 DIAGNOSIS — N8189 Other female genital prolapse: Secondary | ICD-10-CM | POA: Diagnosis not present

## 2020-07-14 DIAGNOSIS — R159 Full incontinence of feces: Secondary | ICD-10-CM

## 2020-07-14 DIAGNOSIS — R3915 Urgency of urination: Secondary | ICD-10-CM | POA: Diagnosis not present

## 2020-07-14 LAB — POCT URINALYSIS DIPSTICK
Appearance: NORMAL
Bilirubin, UA: NEGATIVE
Glucose, UA: POSITIVE — AB
Leukocytes, UA: NEGATIVE
Nitrite, UA: NEGATIVE
Protein, UA: POSITIVE — AB
Spec Grav, UA: >=1.03 — AB (ref 1.010–1.025)
Urobilinogen, UA: 0.2 E.U./dL
pH, UA: 5 (ref 5.0–8.0)

## 2020-07-14 NOTE — Patient Instructions (Addendum)
Accidental Bowel Leakage: Our goal is to achieve formed bowel movements daily or every-other-day without leakage.  You may need to try different combinations of the following options to find what works best for you.  Some management options include: Marland Kitchen Dietary changes (more leafy greens, vegetables and fruits; less processed foods) . Fiber supplementation (Metamucil or something with psyllium as active ingredient) . Over-the-counter imodium (tablets or liquid) to help solidify the stool and prevent leakage of stool. Start with 1-2 mg per day and increase up to 8mg  per day if needed. . If you get constipated you can use Miralax as needed to achieve bowel movements.   For treatment of stress urinary incontinence, which is leakage with physical activity/movement/strainging/coughing, we discussed expectant management versus nonsurgical options versus surgery. Nonsurgical options include weight loss, physical therapy, as well as a pessary.  Surgical options include a midurethral sling, which is a synthetic mesh sling that acts like a hammock under the urethra to prevent leakage of urine, and transurethral injection of a bulking agent.

## 2020-08-04 ENCOUNTER — Telehealth: Payer: Self-pay | Admitting: Obstetrics and Gynecology

## 2020-08-10 ENCOUNTER — Encounter: Payer: Self-pay | Admitting: Internal Medicine

## 2020-08-10 NOTE — Progress Notes (Signed)
Spoke with Kristine Callahan and will move her up to July 21st.

## 2020-09-15 ENCOUNTER — Encounter: Payer: BC Managed Care – PPO | Attending: Obstetrics and Gynecology | Admitting: Physical Therapy

## 2020-09-15 ENCOUNTER — Encounter: Payer: Self-pay | Admitting: Physical Therapy

## 2020-09-15 ENCOUNTER — Other Ambulatory Visit: Payer: Self-pay

## 2020-09-15 DIAGNOSIS — R278 Other lack of coordination: Secondary | ICD-10-CM | POA: Diagnosis present

## 2020-09-15 DIAGNOSIS — M6281 Muscle weakness (generalized): Secondary | ICD-10-CM

## 2020-09-15 NOTE — Therapy (Signed)
[place] at [facility] [address] [place], [place], [address] Phone: [phone]   Fax:  [phone]  Physical Therapy Evaluation  Patient Details  Name: Kristine Callahan MRN: [id number] Date of Birth: [birth year] Referring Provider (PT): Dr. Sherlene Shams   Encounter Date: 09/15/2020   PT End of Session - 09/15/20 1338     Visit Number 1    Date for PT Re-Evaluation 01/15/21    Authorization Type BCBS    PT Start Time 1130    PT Stop Time 1230    PT Time Calculation (min) 60 min    Activity Tolerance Patient tolerated treatment well;No increased pain    Behavior During Therapy WFL for tasks assessed/performed             Past Medical History:  Diagnosis Date   Diabetes mellitus without complication ([place])    type 2   Headache    hx migraines none in years   Hypertension    Obesity    Umbilical hernia     Past Surgical History:  Procedure Laterality Date   CHOLECYSTECTOMY     COLONOSCOPY WITH PROPOFOL N/A 05/31/2016   Procedure: COLONOSCOPY WITH PROPOFOL;  Surgeon: Garlan Fair, MD;  Location: [facility];  Service: Endoscopy;  Laterality: N/A;    There were no vitals filed for this visit.    Subjective Assessment - 09/15/20 1145     Subjective Patient has issues with hemorroids, fecal and fluid coming out of rectum. Bowel movements are small daily. Patient had an impaction 11/2018 and issues since them. Tried an enema and has had issues since then. [name] had fissures and feels they are gone and the Hemorroids continue. She gets a diaper rash in the rectal and vaginal. Type 5 daily. Type 4 every 3 days. Has increased gas. Increased urgency to expel gas and soft cottage cheese stuff come out. I see a GI in July. Patient will push on the perineal body to have a bowel movement.In the fall she will have surgery for the prolapse.    Patient Stated Goals reduce the pressure and reduce the bowel issues     Currently in Pain? Yes    Pain Score 5     Pain Location Rectum    Pain Orientation Mid    Pain Descriptors / Indicators Pressure    Pain Type Chronic pain    Pain Onset More than a month ago    Pain Frequency Intermittent    Aggravating Factors  not sure    Pain Relieving Factors after being on the commode    Multiple Pain Sites No                [facility] PT Assessment - 09/15/20 0001       Assessment   Medical Diagnosis R15.9 Full incontinence of feces; N39.3 Stress incontinence    Referring Provider (PT) Dr. Sherlene Shams    Onset Date/Surgical Date --   past 2 years   Prior Therapy none      Precautions   Precautions None      Restrictions   Weight Bearing Restrictions No      Balance Screen   Has the patient fallen in the past 6 months No    Has the patient had a decrease in activity level because of a fear of falling?  No    Is the patient reluctant to leave their home because of a fear of falling?  No  [place] residence      Prior Function   Level of Independence Independent    Vocation Other (comment)   does not work   Leisure trying to get back inot walking, tried jogging but leak out of rectum, tries some wt. lifting for 4 times per week      Cognition   Overall Cognitive Status Within Functional Limits for tasks assessed      Posture/Postural Control   Posture/Postural Control No significant limitations      ROM / Strength   AROM / PROM / Strength AROM;PROM;Strength      AROM   Overall AROM Comments lumbar ROM is full      Strength   Right Hip ABduction 4-/5    Left Hip ABduction 4-/5      Palpation   Palpation comment decreased lower rib mobility; doming of the midline of abdominals with contraction; decreased back expansion of the rib cage                        Objective measurements completed on examination: See above findings.     Pelvic Floor Special Questions - 09/15/20  0001     Prior Pregnancies Yes    Number of Pregnancies 5    Number of Vaginal Deliveries 3    Any difficulty with labor and deliveries Yes   she had stitiches with her third child   Diastasis Recti umbilical hernia; 4 finger width above the umbilicus, 2 fingers below the umbilicus    Currently Sexually Active No   due to the rash, fecal leakage   Marinoff Scale --   pain with deeper penetration, dryness   Urinary Leakage Yes    Pad use no pads    Activities that cause leaking With strong urge;Other    Other activities that cause leaking when the perineal and rectal area are irritated    Urinary urgency Yes    Fecal incontinence Yes   strain to have a bowel movement, does not empty stools   Falling out feeling (prolapse) Yes    Skin Integrity Erthema;Hemorroids;Irritaion present at;Other    Skin Integrity other dryness    Skin Integrity Irritation Present at vulvar area, rectum, perineal    Perineal Body/Introitus  Gaping    Prolapse Posterior Wall;Uterine    Pelvic Floor Internal Exam Patient confirms identification and approves PT to assess pelvic floor and treatment.    Exam Type Vaginal;Rectal    Palpation tenderness located in the levator ani and obturator internist, contract rectally except for the anterior protion    Strength weak squeeze, no lift   vaginally decreased on the sides and ant. on the right, anally decreased anteriorly                     PT Education - 09/15/20 1334     Education Details education on prolapse and rectocele; education on skin care for the vaginal and rectal area; education on vaginal canal moisturizers and vulvar moisturizer    Person(s) Educated Patient    Methods Explanation;Handout    Comprehension Verbalized understanding              PT Short Term Goals - 09/15/20 1353       PT SHORT TERM GOAL #1   Title independent with diaphgramatic breathing, bracing of the abdomen with minimal doming, and  low level pelvic floor  contraction    Time  4    Period Weeks    Status New    Target Date 10/13/20      PT SHORT TERM GOAL #2   Title education on how to manage prolapse and vulvar care    Time 4    Period Weeks    Status New    Target Date 10/13/20      PT SHORT TERM GOAL #3   Title educated on correct toileting techniques to reduce strain on abdomen and pelvic floor    Time 4    Period Weeks    Status New    Target Date 10/13/20      PT SHORT TERM GOAL #4   Title education on vaginal moisturizers and lubricants for vaginal health    Time 4    Period Weeks    Status New    Target Date 10/13/20               PT Long Term Goals - 09/15/20 1355       PT LONG TERM GOAL #1   Title independent with advanced HEP for core strength and pelvic floor strength    Time 4    Period Months    Status New    Target Date 01/15/21      PT LONG TERM GOAL #2   Title straining to have a bowel movement decreased >/= 50% due to improve strength and coordination of the pelvic floor    Time 4    Period Months    Status New    Target Date 01/15/21      PT LONG TERM GOAL #3   Title fecal leakage decreased >/= 60% due to increased pelvic floro tone and strength >/= 3/5 holding for 10 seconds    Time 4    Period Months    Status New    Target Date 01/15/21      PT LONG TERM GOAL #4   Title able to reduce gas leakage >/= 60% due to improve coordination and control of the pelvic floor muscles    Time 4    Period Months    Status New    Target Date 01/15/21                    Plan - 09/15/20 1340     Clinical Impression Statement Patient is a 55 year old female with fecal and stress incontinence for the past 2 years. She was impacted and used an enema 2 years agl when th eissues started. Patient has an intermittent rectal pressure at level 5/10. She has a type 5 bowel movement daily and type 4 every third day that may be long or short. She reports she has to strain to have a bowel movement  and sometimes feels something is blocking it. She ahas difficutly passing gas due to feeling like somthimg is blocking it but will have increased gas all day. She leaks stool in standing and walking. She will leak urine when she gets the urge to urinate and is right at the toilet. Patient does not have stool or urine leakage at night. She does not wear pads. She feels a bulge in the perineal area all the time. Patient reports a rash in the rectal and perineal area that hurts and is embarrasssing. Patient also reports dryness in the perineal and rectal area. The rash and pain from vaginal dryness and stool leakage  deters her from having interocurse with her husband. Patient reports  a bulge in the vaginal area that is worse in the past 6-8 months. Pelvic floor strength is 2/5 with the vaginal weaker on the sides and right anterior portion. The rectal strength is 2/5 with increased wekness anteriorly. She has tenderness throughout the pelvic floor muscles but some of this could be the dryness she has. Paitnet has an umbilical hernal that will bulge out with the diastasis recti with movement. the diastasis Recti is 4 fingers width superiorly with minimal tension and 2 fingers width below the umbilicus with very little tension. Patient will benefit from skilled therapy to reduce the leakage, improve quality of abdominal and pelvic floor contraction to reduce the urinary and fecal leakage.    Personal Factors and Comorbidities Age;Sex;Comorbidity 3+;Fitness;Past/Current Experience;Time since onset of injury/illness/exacerbation    Comorbidities Hysterectomy; Diabetes; umbilical hernia    Examination-Activity Limitations Bed Mobility;Carry;Continence;Lift;Toileting    Examination-Participation Restrictions Cleaning;Community Activity;Interpersonal Relationship;Laundry    Stability/Clinical Decision Making Evolving/Moderate complexity    Clinical Decision Making Moderate    Rehab Potential Good    PT Frequency 1x /  week    PT Duration Other (comment)   4 months   PT Treatment/Interventions ADLs/Self Care Home Management;Electrical Stimulation;Cryotherapy;Moist Heat;Therapeutic activities;Therapeutic exercise;Neuromuscular re-education;Patient/family education;Manual techniques;Taping;Spinal Manipulations    PT Next Visit Plan manual work to the back and abdomen to reduce domiing then taping; manual work internally, improve rib mobility, work on movement without doming the abdomen; toileting, diaphragmatic breathing, see if the perineal skin is better    Consulted and Agree with Plan of Care Patient             Patient will benefit from skilled therapeutic intervention in order to improve the following deficits and impairments:  Decreased endurance, Impaired tone, Decreased activity tolerance, Decreased strength, Increased fascial restricitons, Pain, Increased muscle spasms, Decreased coordination  Visit Diagnosis: Muscle weakness (generalized) - Plan: PT plan of care cert/re-cert  Other lack of coordination - Plan: PT plan of care cert/re-cert     Problem List Patient Active Problem List   Diagnosis Date Noted   Type 2 diabetes mellitus with diabetic polyneuropathy, without long-term current use of insulin ([place]) 11/29/2019   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

## 2020-09-15 NOTE — Patient Instructions (Signed)
About Rectocele Overview A rectocele is a type of hernia which causes different degrees of bulging of the rectal tissues into the vaginal wall. You may even notice that it presses against the vaginal wall so much that some vaginal tissues droop outside of the opening of your vagina.  Causes of Rectocele The most common cause is childbirth. The muscles and ligaments in the pelvis that hold up and support the female organs and vagina become stretched and weakened during labor and delivery. The more babies you have, the more the support tissues are stretched and weakened. Not everyone who has a baby will develop a rectocele. Some women have stronger supporting tissue in the pelvis and may not have as much of a problem as others. Women who have a Cesarean section usually do not get rectoceles unless they pushed a long time prior to the cesarean delivery.  Other conditions that can cause a rectocele include chronic constipation, a chronic cough, a lot of heavy lifting, and obesity. Older women may have this problem because the loss of female hormones causes the vaginal tissue to become weaker.  Symptoms There may not be any symptoms. If you do have symptoms, they may include:  pelvic pressure in the rectal area  protrusion of the lower part of the vagina through the opening of the vagina  constipation and trapping of the stool, making it difficult to have a bowel movement. In severe cases, you may have to press on the lower part of your vagina to help push the stool out of your rectum.  This is called splinting to empty. Diagnosing Rectocele Your health care provider will ask about your symptoms and perform a pelvic exam. S/he will ask you to bear down, pushing like you are having a bowel movement so as to see how far the lower part of the vagina protrudes into the vagina and possibly outside of the vagina. Your provider will also ask you to contract the muscles of your pelvis (like you are stopping the  stream in the middle of urinating) to determine the strength of your pelvic muscles. Your provider may also do a rectal exam.    Treatment Options If you do not have any symptoms, no treatment may be necessary. Other treatment options include:  Pelvic floor exercises: Contracting the muscles in your genital area may help strengthen your muscles and support the organs.  Be sure to get proper exercise instruction from your physical therapist. A pessary (removable pelvic support device) sometimes helps rectocele symptoms. Surgery: Surgical repair may be necessary. In some cases the uterus may need to be taken out (a hysterectomy) as well.  There are many types of surgery for pelvic support problems.  Look for physicians who specialize in repair procedures. You can take care of yourself by:  treating and preventing constipation  avoiding heavy lifting, and lifting correctly (with your legs, not with your waist or back)  treating a chronic cough or bronchitis  not smoking  avoiding too much weight gain  doing pelvic floor exercises   About Pelvic Support Problems Pelvic Support Problems Explained Ligaments, muscles, and connective tissue normally hold your bladder, uterus, and other organs in their proper places in your pelvis. When these tissues become weak, a problem with pelvic support may result. Weak support can cause one or more of the pelvic organs to drop down into the vagina. An organ may even drop so far that is partially exposed outside the body.  Pelvic support problems are named  by the change in the organ. The main types of pelvic support problems are:  Cystocele: When the bladder drops down into your vagina.  Enterocele: When your small intestine drops between your vagina and rectum.  Rectocele: When your rectum bulges into the vaginal wall.  Uterine prolapse: When your uterus drops into your vagina.  Vaginal prolapse: When the top part of the vagina begins to droop. This sometimes  happens after a hysterectomy (removal of the uterus).  Causes Pelvic support problems can be caused by many conditions. They may begin after you give birth, especially if you had a large baby. During childbirth, the muscles and skin of the birth canal (vagina) are stretched and sometimes torn. They heal over time but are not always exactly the same. A long pushing stage of labor may also weaken these tissues as well as very rapid births as the tissues do not have time to stretch so they tear.  Also, after menopause, there are changes in the vaginal walls resulting from a decrease in estrogen. Estrogen helps to keep the tissues toned. Low levels of estrogen weaken the vaginal walls and may cause the bladder to shift from its normal position. As women get older, the loss of muscle tone and the relaxation of muscles may cause the uterus or other organs to drop.  Over time, conditions like chronic coughing, chronic constipation, doing a lot of heavy lifting, straining to pass stool, and obesity, can also weaken the pelvic support muscles.  Diagnosing Pelvic Support Problems Your health care provider will ask about your symptoms and do a pelvic examination. Your provider may also do a rectal exam during your pelvic exam. Your provider may ask you to: 1. Bear down and push (like you are having a bowel movement) so he or she can see if your bladder or other part of your body protrudes into the vagina. 2. Contract the muscles of your pelvis to check the strength of your pelvic muscles.  3. Do several types of urine, nerve and muscle tests of the pelvis and around the bladder to see what type of treatment is best for you.   Symptoms Symptoms of pelvic support problems depend on the organ involved, but may include:  urine leakage  stain or fecal loss after a bowel movement trouble having bowel movements  ache in the lower abdomen, groin, or lower back  bladder infection  a feeling of heaviness, pulling, or  fullness in the pelvis, or a feeling that something is falling out of the vagina  an organ protruding from your vaginal opening  feeling the need to support the organs or perineal area to empty bladder or bowels painful sexual intercourse.  Many women feel pelvic pressure or trouble holding their urine immediately after childbirth. For some, these symptoms go away permanently, in others they return as they get older.  Treatment Options A prolapsed organ cannot repair itself. Contact your health care provider as soon as you notice symptoms of a problem. Treatment depends on what the specific problem is and how far advanced it is.  The symptoms caused by some pelvic support problems may simply be treated with changes in diet, medicine to soften the stool, weight loss, or avoiding strenuous activities. You may also do pelvic floor exercises to help strengthen your pelvic muscles.  Some cases of prolapse may require a special support device made from plastic or rubber called a pessary that fits into the vagina to support the uterus, vagina, or bladder. A  pessary can also help women who leak urine when coughing, straining, or exercising. In mild cases, a tampon or vaginal diaphragm may be used instead of a pessary.  Talk to your doctor or health care provider about these options. In serious cases, surgery may be needed to put the organs back into their proper place. The uterus may be removed because of the pressure it puts on the bladder.  Your doctor will know what surgery will be best for you. How can I prevent pelvic support problems?  You can help prevent pelvic support problems by:  maintaining a healthy lifestyle  continuing to do pelvic floor exercises after you deliver a baby  maintaining a healthy weight  avoiding a lot of heavy lifting and lifting with your legs (not from your waist)  treating constipation and avoid getting    Moisturizers They are used in the vagina to hydrate the mucous  membrane that make up the vaginal canal. Designed to keep a more normal acid balance (ph) Once placed in the vagina, it will last between two to three days.  Use 2-3 times per week at bedtime  Ingredients to avoid is glycerin and fragrance, can increase chance of infection Should not be used just before sex due to causing irritation Most are gels administered either in a tampon-shaped applicator or as a vaginal suppository. They are non-hormonal.   Types of Moisturizers(internal use)  Vitamin E vaginal suppositories- Whole foods, Amazon Moist Again Coconut oil- can break down condoms Julva- (Do no use if on Tamoxifen) amazon Yes moisturizer- amazon NeuEve Silk , NeuEve Silver for menopausal or over 65 (if have severe vaginal atrophy or cancer treatments use NeuEve Silk for  1 month than move to [employer])- [employer], [place].com Olive and Bee intimate cream- www.oliveandbee.com.au Mae vaginal moisturizer- Amazon Aloe    Creams to use externally on the Vulva area [employer]'s (good for for cancer patients that had radiation to the area)- [place] or [employer].FlyingBasics.com.br V-magic cream - amazon Julva-amazon Vital "V [place] salve ( help moisturize and help with thinning vulvar area, does have [place] by Irwin Brakeman labial moisturizer (Amazon,  Coconut or olive oil aloe   Things to avoid in the vaginal area Do not use things to irritate the vulvar area No lotions just specialized creams for the vulva area- Neogyn, V-magic, No soaps; can use Aveeno or Calendula cleanser if needed. Must be gentle No deodorants No douches Good to sleep without underwear to let the vaginal area to air out No scrubbing: spread the lips to let warm water rinse over labias and pat dry  Earlie Counts, PT [facility] [place] [address], [place], [place] [address] W:  [phone] [email or web address]

## 2020-09-29 ENCOUNTER — Encounter: Payer: Self-pay | Admitting: Physical Therapy

## 2020-09-29 ENCOUNTER — Encounter: Payer: BC Managed Care – PPO | Attending: Obstetrics and Gynecology | Admitting: Physical Therapy

## 2020-09-29 ENCOUNTER — Other Ambulatory Visit: Payer: Self-pay

## 2020-09-29 DIAGNOSIS — R278 Other lack of coordination: Secondary | ICD-10-CM | POA: Insufficient documentation

## 2020-09-29 DIAGNOSIS — M6281 Muscle weakness (generalized): Secondary | ICD-10-CM | POA: Diagnosis not present

## 2020-09-29 NOTE — Therapy (Signed)
[place] at [facility] [address] [place], [place], [address] Phone: [phone]   Fax:  [phone]  Physical Therapy Treatment  Patient Details  Name: Kristine Callahan MRN: [id number] Date of Birth: [birth year] Referring Provider (PT): Dr. Sherlene Shams   Encounter Date: 09/29/2020   PT End of Session - 09/29/20 1025     Visit Number 2    Date for PT Re-Evaluation 01/15/21    Authorization Type BCBS    PT Start Time 0830    PT Stop Time 0920    PT Time Calculation (min) 50 min    Activity Tolerance Patient tolerated treatment well;No increased pain    Behavior During Therapy WFL for tasks assessed/performed             Past Medical History:  Diagnosis Date   Diabetes mellitus without complication ([place])    type 2   Headache    hx migraines none in years   Hypertension    Obesity    Umbilical hernia     Past Surgical History:  Procedure Laterality Date   CHOLECYSTECTOMY     COLONOSCOPY WITH PROPOFOL N/A 05/31/2016   Procedure: COLONOSCOPY WITH PROPOFOL;  Surgeon: Garlan Fair, MD;  Location: [facility];  Service: Endoscopy;  Laterality: N/A;    There were no vitals filed for this visit.   Subjective Assessment - 09/29/20 0834     Subjective I have been bleeding in the rectum since the last treatment. Less rectal pain. Trying to use the abdomen with toileting.    Patient Stated Goals reduce the pressure and reduce the bowel issues    Currently in Pain? Yes    Pain Score 3     Pain Location Rectum    Pain Orientation Mid    Pain Descriptors / Indicators Pressure    Pain Type Chronic pain    Pain Onset More than a month ago    Pain Frequency Intermittent    Aggravating Factors  not sure    Pain Relieving Factors after being on the commode    Multiple Pain Sites No                               [facility] Adult PT Treatment/Exercise - 09/29/20 0001       Lumbar Exercises:  Supine   Ab Set 20 reps;1 second    AB Set Limitations tried with just breath, using a red band to wrap around the abdoment for facilitation, then used a ball queeze which helped the most and she was able to feel the slightest contraction      Manual Therapy   Manual Therapy Myofascial release;Soft tissue mobilization;Taping;Joint mobilization    Manual therapy comments lower rib cage mobilization to improve the bucket handle movement of the lower rib cage    Soft tissue mobilization manual mobilization to the thoracic and lumbar paraspinals, between the post. lower rib cage and the lateral spenct for mobility and along the iliac crest; manually worked on the diaphgram in sidely; manual work to bilateral abdominals in sidely to [employer] tissue mobility    Myofascial Release using the suction cup to the thoracic and lumbar in prone to lift the skin up from the muscle and release the tight fascia    Kinesiotex Facilitate Muscle      Kinesiotix   Facilitate Muscle  along the midline of the abdomen to reduce the diastasis recti  and increase abdominal tension                    PT Education - 09/29/20 0927     Education Details Access Code: [email or web address]  ; educated her to take the kinesiotape off in 3 days unless she feels irritation    Person(s) Educated Patient    Methods Explanation    Comprehension Verbalized understanding              PT Short Term Goals - 09/15/20 1353       PT SHORT TERM GOAL #1   Title independent with diaphgramatic breathing, bracing of the abdomen with minimal doming, and  low level pelvic floor contraction    Time 4    Period Weeks    Status New    Target Date 10/13/20      PT SHORT TERM GOAL #2   Title education on how to manage prolapse and vulvar care    Time 4    Period Weeks    Status New    Target Date 10/13/20      PT SHORT TERM GOAL #3   Title educated on correct toileting techniques to reduce strain on abdomen and pelvic floor     Time 4    Period Weeks    Status New    Target Date 10/13/20      PT SHORT TERM GOAL #4   Title education on vaginal moisturizers and lubricants for vaginal health    Time 4    Period Weeks    Status New    Target Date 10/13/20               PT Long Term Goals - 09/15/20 1355       PT LONG TERM GOAL #1   Title independent with advanced HEP for core strength and pelvic floor strength    Time 4    Period Months    Status New    Target Date 01/15/21      PT LONG TERM GOAL #2   Title straining to have a bowel movement decreased >/= 50% due to improve strength and coordination of the pelvic floor    Time 4    Period Months    Status New    Target Date 01/15/21      PT LONG TERM GOAL #3   Title fecal leakage decreased >/= 60% due to increased pelvic floro tone and strength >/= 3/5 holding for 10 seconds    Time 4    Period Months    Status New    Target Date 01/15/21      PT LONG TERM GOAL #4   Title able to reduce gas leakage >/= 60% due to improve coordination and control of the pelvic floor muscles    Time 4    Period Months    Status New    Target Date 01/15/21                   Plan - 09/29/20 1026     Clinical Impression Statement Patient had improved mobility of the lumbar and lower rib cage mobility. She has difficulty with feeling the lower abdominal contraction and needed many cues and movements to assist with the contraction. She is trying to engage her abdominals for bowel movements. Therapist used kinesio tape to reduce the diastasis and umbilical hernia so the abdominal muscles could engage better. She had tightness around the lower abdominals, around the  umbilicus and right lower rib cage. Patient had rectal bleeding since the internal exam but she feels it is the internal hemorroid that opened up. Patient will benefit from skilled therapy to reduce the leakage, improve quality of abdominal and pelvic floor contraction to reduce the urinary and  fecal leakage.    Personal Factors and Comorbidities Age;Sex;Comorbidity 3+;Fitness;Past/Current Experience;Time since onset of injury/illness/exacerbation    Comorbidities Hysterectomy; Diabetes; umbilical hernia    Examination-Activity Limitations Bed Mobility;Carry;Continence;Lift;Toileting    Examination-Participation Restrictions Cleaning;Community Activity;Interpersonal Relationship;Laundry    Stability/Clinical Decision Making Evolving/Moderate complexity    Rehab Potential Good    PT Frequency 1x / week    PT Duration Other (comment)   4 months   PT Treatment/Interventions ADLs/Self Care Home Management;Electrical Stimulation;Cryotherapy;Moist Heat;Therapeutic activities;Therapeutic exercise;Neuromuscular re-education;Patient/family education;Manual techniques;Taping;Spinal Manipulations    PT Next Visit Plan educate on vaginal dryness; see how the tape helped; work on rib mobility; toileting; diaphragmatic breathing; abdominal contraction with arm movement; education on prolpase care    PT Home Exercise Plan Access Code: [email or web address]    Recommended Other Services MD signed initial note    Consulted and Agree with Plan of Care Patient             Patient will benefit from skilled therapeutic intervention in order to improve the following deficits and impairments:  Decreased endurance, Impaired tone, Decreased activity tolerance, Decreased strength, Increased fascial restricitons, Pain, Increased muscle spasms, Decreased coordination  Visit Diagnosis: Muscle weakness (generalized)  Other lack of coordination     Problem List Patient Active Problem List   Diagnosis Date Noted   Type 2 diabetes mellitus with diabetic polyneuropathy, without long-term current use of insulin ([place]) 11/29/2019   Type 2 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, without long-term current use of insulin ([place]) 11/29/2019   Uncontrolled type 2 diabetes mellitus with hyperglycemia,  with long-term current use of insulin ([place]) 11/29/2019   Dyslipidemia 11/29/2019    Earlie Counts, PT 09/29/20 10:32 AM   [facility] [address] [place], [place], [address] Phone: [phone]   Fax:  [phone]  Name: Kristine Callahan MRN: [id number] Date of Birth: 1965-11-17

## 2020-09-29 NOTE — Patient Instructions (Signed)
Access Code: [email or web address] URL: [email or web address] Date: 09/29/2020 Prepared by: Earlie Counts  Exercises Hooklying Transversus Abdominis Palpation - 3 x daily - 7 x weekly - 1 sets - 10 reps  Earlie Counts, PT [facility] [place] [address], [place], [place] [address] W: [phone] [email or web address]

## 2020-10-06 ENCOUNTER — Encounter: Payer: Self-pay | Admitting: Physical Therapy

## 2020-10-06 ENCOUNTER — Other Ambulatory Visit: Payer: Self-pay

## 2020-10-06 ENCOUNTER — Encounter: Payer: BC Managed Care – PPO | Admitting: Physical Therapy

## 2020-10-06 DIAGNOSIS — R278 Other lack of coordination: Secondary | ICD-10-CM

## 2020-10-06 DIAGNOSIS — M6281 Muscle weakness (generalized): Secondary | ICD-10-CM

## 2020-10-06 NOTE — Patient Instructions (Signed)
Access Code: [email or web address] URL: [email or web address] Date: 10/06/2020 Prepared by: Earlie Counts  Exercises Hooklying Transversus Abdominis Palpation - 3 x daily - 7 x weekly - 1 sets - 10 reps Cat Cow - 1 x daily - 7 x weekly - 1 sets - 15 reps Child's Pose Stretch - 1 x daily - 7 x weekly - 1 sets - 2 reps - 30 sec hold Sidelying Open Book Thoracic Lumbar Rotation and Extension - 1 x daily - 7 x weekly - 1 sets - 10 reps Sidelying Reverse Clamshell - 1 x daily - 7 x weekly - 1 sets - 10 reps  Earlie Counts, PT [facility] [address] [place], [place] [address] W: [phone] [email or web address]

## 2020-10-06 NOTE — Therapy (Signed)
[place] at [facility] [address] [place], [place], [address] Phone: [phone]   Fax:  [phone]  Physical Therapy Treatment  Patient Details  Name: Kristine Callahan MRN: [id number] Date of Birth: 04-25-1965 Referring Provider (PT): Dr. Sherlene Shams   Encounter Date: 10/06/2020   PT End of Session - 10/06/20 1557     Visit Number 3    Date for PT Re-Evaluation 01/15/21    Authorization Type BCBS    PT Start Time 1300    PT Stop Time 6629    PT Time Calculation (min) 55 min    Activity Tolerance Patient tolerated treatment well;No increased pain    Behavior During Therapy WFL for tasks assessed/performed             Past Medical History:  Diagnosis Date   Diabetes mellitus without complication ([place])    type 2   Headache    hx migraines none in years   Hypertension    Obesity    Umbilical hernia     Past Surgical History:  Procedure Laterality Date   CHOLECYSTECTOMY     COLONOSCOPY WITH PROPOFOL N/A 05/31/2016   Procedure: COLONOSCOPY WITH PROPOFOL;  Surgeon: Garlan Fair, MD;  Location: [facility];  Service: Endoscopy;  Laterality: N/A;    There were no vitals filed for this visit.   Subjective Assessment - 10/06/20 1301     Subjective The tape helped my stomach not stick out as much. Stomach feels a little tighter. I still have some rectal bleeding. It is getting better. I am using the Aloe and it is helping. The one bad hemorroisd is deflating. REctal pain is 75% better.    Patient Stated Goals reduce the pressure and reduce the bowel issues    Currently in Pain? No/denies                               [facility] Adult PT Treatment/Exercise - 10/06/20 0001       Lumbar Exercises: Sidelying   Clam Right;Left;10 reps;1 second    Other Sidelying Lumbar Exercises open book for thoracic rotation 15x each side      Lumbar Exercises: Quadruped   Madcat/Old Horse 15 reps     Madcat/Old Horse Limitations tactile cues to move the vertebrae      Manual Therapy   Manual Therapy Taping;Soft tissue mobilization;Myofascial release    Soft tissue mobilization manual mobilization t othe low thoracic and lumbar paraspinals, quadratus, gluteal, around the iliac crest. obliques, and lower abdominals    Myofascial Release using the suction cup to release the fascial from the muscle and skin along the low thorasis, along the lateral trunk and the obliques    Kinesiotex Facilitate Muscle      Kinesiotix   Facilitate Muscle  along the midline of the abdomen to reduce the diastasis recti and increase abdominal tension                    PT Education - 10/06/20 1402     Education Details Access Code: [email or web address]    Person(s) Educated Patient    Methods Explanation;Demonstration;Handout    Comprehension Verbalized understanding;Returned demonstration              PT Short Term Goals - 10/06/20 1602       PT SHORT TERM GOAL #1   Title independent with diaphgramatic breathing, bracing of the abdomen  with minimal doming, and  low level pelvic floor contraction    Time 4    Period Weeks    Status On-going      PT SHORT TERM GOAL #2   Title education on how to manage prolapse and vulvar care    Time 4    Period Weeks    Status On-going      PT SHORT TERM GOAL #3   Title educated on correct toileting techniques to reduce strain on abdomen and pelvic floor    Time 4    Period Weeks    Status On-going      PT SHORT TERM GOAL #4   Title education on vaginal moisturizers and lubricants for vaginal health    Time 4    Period Weeks    Status Achieved               PT Long Term Goals - 09/15/20 1355       PT LONG TERM GOAL #1   Title independent with advanced HEP for core strength and pelvic floor strength    Time 4    Period Months    Status New    Target Date 01/15/21      PT LONG TERM GOAL #2   Title straining to have a bowel movement  decreased >/= 50% due to improve strength and coordination of the pelvic floor    Time 4    Period Months    Status New    Target Date 01/15/21      PT LONG TERM GOAL #3   Title fecal leakage decreased >/= 60% due to increased pelvic floro tone and strength >/= 3/5 holding for 10 seconds    Time 4    Period Months    Status New    Target Date 01/15/21      PT LONG TERM GOAL #4   Title able to reduce gas leakage >/= 60% due to improve coordination and control of the pelvic floor muscles    Time 4    Period Months    Status New    Target Date 01/15/21                   Plan - 10/06/20 1558     Clinical Impression Statement [name] had to take breaks during therapy due to feeling dizzy when she lays down. Patient is using aloe for vaginal moisturizer. Patient reports her rectal pain is 75% better. She is bleeding rectally less. The fecal leakage is better. Yesterday she only changed 1 pair of underwear compared to 3-4. [place] is able to move with minimal to no doomin of the abdominals due to the ability to engage the abdominals. She still has a umbilical hernia. Patient reports she had 2 complete stools since last visit for the first time. The tissu in the back, gluteal and lateral trunk are continuing to get more movement and the coloring of the tissue is looking better. She continues to have difficulty with opening her rib cage. Patient will benefit from skilled therapy to reduce her leakge, improve quality of abdominal and pelvic floor contraction to reduce urinary and fecal leakage.    Personal Factors and Comorbidities Age;Sex;Comorbidity 3+;Fitness;Past/Current Experience;Time since onset of injury/illness/exacerbation    Comorbidities Hysterectomy; Diabetes; umbilical hernia    Examination-Activity Limitations Bed Mobility;Carry;Continence;Lift;Toileting    Examination-Participation Restrictions Cleaning;Community Activity;Interpersonal Relationship;Laundry     Stability/Clinical Decision Making Evolving/Moderate complexity    Rehab Potential Good  PT Frequency 1x / week    PT Duration Other (comment)   4 months   PT Treatment/Interventions ADLs/Self Care Home Management;Electrical Stimulation;Cryotherapy;Moist Heat;Therapeutic activities;Therapeutic exercise;Neuromuscular re-education;Patient/family education;Manual techniques;Taping;Spinal Manipulations    PT Next Visit Plan tape abdominals if needed, work on rib mobility; toileting; diaphragmatic breathing; abdominal contraction with arm movement; education on prolpase care; see how visit went with Dr. Carlean Purl    PT Home Exercise Plan Access Code: [email or web address]    Consulted and Agree with Plan of Care Patient             Patient will benefit from skilled therapeutic intervention in order to improve the following deficits and impairments:  Decreased endurance, Impaired tone, Decreased activity tolerance, Decreased strength, Increased fascial restricitons, Pain, Increased muscle spasms, Decreased coordination  Visit Diagnosis: Muscle weakness (generalized)  Other lack of coordination     Problem List Patient Active Problem List   Diagnosis Date Noted   Type 2 diabetes mellitus with diabetic polyneuropathy, without long-term current use of insulin ([place]) 11/29/2019   Type 2 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, without long-term current use of insulin ([place]) 11/29/2019   Uncontrolled type 2 diabetes mellitus with hyperglycemia, with long-term current use of insulin ([place]) 11/29/2019   Dyslipidemia 11/29/2019    Earlie Counts, PT 10/06/20 4:05 PM  [facility] [address], [place], [place], [address] Phone: [phone]   Fax:  [phone]  Name: Kristine Callahan MRN: [id number] Date of Birth: 05/21/1965

## 2020-10-07 NOTE — Telephone Encounter (Signed)
  D[place] with [facility] called regarding discrepancy in the chart. Chart corrected.

## 2020-10-08 ENCOUNTER — Encounter: Payer: Self-pay | Admitting: Internal Medicine

## 2020-10-08 ENCOUNTER — Ambulatory Visit (INDEPENDENT_AMBULATORY_CARE_PROVIDER_SITE_OTHER): Payer: BC Managed Care – PPO | Admitting: Internal Medicine

## 2020-10-08 VITALS — BP 160/68 | HR 76 | Ht 70.0 in | Wt 230.0 lb

## 2020-10-08 DIAGNOSIS — K429 Umbilical hernia without obstruction or gangrene: Secondary | ICD-10-CM

## 2020-10-08 DIAGNOSIS — N8189 Other female genital prolapse: Secondary | ICD-10-CM

## 2020-10-08 DIAGNOSIS — K625 Hemorrhage of anus and rectum: Secondary | ICD-10-CM | POA: Diagnosis not present

## 2020-10-08 DIAGNOSIS — K644 Residual hemorrhoidal skin tags: Secondary | ICD-10-CM

## 2020-10-08 DIAGNOSIS — M6208 Separation of muscle (nontraumatic), other site: Secondary | ICD-10-CM

## 2020-10-08 DIAGNOSIS — R194 Change in bowel habit: Secondary | ICD-10-CM | POA: Diagnosis not present

## 2020-10-08 DIAGNOSIS — R159 Full incontinence of feces: Secondary | ICD-10-CM

## 2020-10-08 DIAGNOSIS — Z6833 Body mass index (BMI) 33.0-33.9, adult: Secondary | ICD-10-CM

## 2020-10-08 DIAGNOSIS — L309 Dermatitis, unspecified: Secondary | ICD-10-CM

## 2020-10-08 MED ORDER — NYSTATIN-TRIAMCINOLONE 100000-0.1 UNIT/GM-% EX OINT: 1.0000 "application " | TOPICAL_OINTMENT | Freq: Two times a day (BID) | CUTANEOUS | 1 refills | Status: DC

## 2020-10-08 MED ORDER — HYDROCORTISONE (PERIANAL) 2.5 % EX CREA: 1.0000 "application " | TOPICAL_CREAM | Freq: Two times a day (BID) | CUTANEOUS | 1 refills | Status: DC

## 2020-10-08 NOTE — Progress Notes (Signed)
Kristine Callahan 55 y.o. Kristine Callahan 17, 1967 [id number] Referred by: Dr. Sherlene Shams Assessment & Plan:   Encounter Diagnoses  Name Primary?   Change in bowel habits Yes   Rectal bleeding    Full incontinence of feces    Umbilical hernia without obstruction and without gangrene    Diastasis recti    Pelvic floor weakness in female    Perianal dermatitis    Anorectal skin tags    BMI 33.0-33.9,adult      Complex set of problems here, overall I wonder if she does not have Crohn's disease based on the appearance of her perianal area and these bowel habit changes and what I saw at a anoscopy.  The umbilical hernia is certainly of a size that is reasonable to have surgery for though her abdominal obesity is an issue and the ongoing bowel issues that could represent inflammatory bowel disease preclude any surgical referral now although it should be appropriate in the future once we sort through her issues.   I agree with Dr. Wannetta Sender that bowel issue should be sorted out before any pelvic floor surgeries are undertaken.  Treatment plans as below, symptomatic treatment of these perianal changes.  She may need more aggressive treatment, Gerhard's Butt cream may be needed as well. Meds ordered this encounter  Medications   nystatin-triamcinolone ointment (MYCOLOG)    Sig: Apply 1 application topically 2 (two) times daily.    Dispense:  60 g    Refill:  1   hydrocortisone (ANUSOL-HC) 2.5 % rectal cream    Sig: Place 1 application rectally 2 (two) times daily.    Dispense:  30 g    Refill:  1   Orders Placed This Encounter  Procedures   Ambulatory referral to Gastroenterology   Colonoscopy to evaluate bowel changes rectal bleeding incontinence question inflammatory bowel disease.  The risks and benefits as well as alternatives of endoscopic procedure(s) have been discussed and reviewed. All questions answered. The patient agrees to proceed.  Depending upon what is seen and my concern  for perianal disease she may need a pelvic MRI.  I have requested the labs performed at [facility] within the last year.  I can see some of them through [place] and we used to be able to see more through care everywhere but that seems to have changed since the recent epic upgrade.   Regarding her umbilical hernia and diastases recti certainly the umbilical hernia is such a size that merits repair however her underlying as of yet undiagnosed problems with the gut and obesity issues make surgical referral inappropriate at this time.  Especially the former.  Regarding abdominal obesity metabolic syndrome and uncontrolled diabetes and her bowel issues I do think a low carbohydrate diet could be very useful though this is not the time to introduce that.  CC: Sherlene Shams, MD Leeroy Cha, MD Earlie Counts, PT  Subjective:   Chief Complaint: Fecal incontinence and rectal bleeding  HPI 55 year old white woman referred by Dr. Wannetta Sender because of fecal incontinence and rectal bleeding.  The patient had a normal colonoscopy by Dr. Wynetta Emery in 2018.  She has been suffering with rectal bleeding full incontinence of feces loose stools almost like a cottage cheese she says, off and on with increasing frequency since 2020.  She reports that she had issues with constipation and tried a lot of different over-the-counter agents used an enema and ever since then she has had rectal bleeding and problems.  She has terrible perianal inflammation  that is uncomfortable.  She defecates every time she urinates and has fecal oozing and leakage.  Persistent rectal bleeding.  No topical treatments tried.  She has used some loperamide with some benefit.  She saw Dr. Schroeder because of these problems.  She was referred by her regular gynecologist because of constipation and the need to push on the vagina to have a bowel movement.  She had uterine and posterior prolapse noted on gynecologic exam prior to that  referral.  She leaks urine with cough and sneeze laughing exercise lifting during sex with a full bladder and with urgency.  She has a bulge in the vaginal area x2 years.  Bowel movements 6-8 times a day Bristol 4-5.  Accidental bowel leakage leaks gas cannot tell whether it is gas or stool at times.  Told Dr. Schroeder as she told me this started about 2020 after the enema.   On Dr. Schroeder's exam pelvic floor strength 3 out of 5 puborectalis 2 out of 5 in external anal sphincter 1 out of 5.  No tenderness to the pelvic floor muscles.  She had a normal-appearing external female genitalia.  Atrophic vaginal mucosa normal cervix otherwise normal bimanual.  Dr. Schroeder palpated decreased central tone large distal rectocele no enterocele no masses and no signs of dyssynergia.   She also struggles with an umbilical hernia that she says her primary care provider told her was not large enough to be repaired.  It sounds like stool gets impacted in it, she has been working with Cheryl Gray of physical therapy to try to manually reduce this at times as well as deal with her diastases recti which is also treated with taping. Allergies  Allergen Reactions   Latex Other (See Comments)    Taste sensation    Current Meds  Medication Sig   aspirin 81 MG tablet Take 81 mg by mouth daily.   Blood Glucose Monitoring Suppl (ACCU-CHEK GUIDE ME) w/Device KIT by Does not apply route.   dapagliflozin propanediol (FARXIGA) 10 MG TABS tablet Take 10 mg by mouth daily.   docusate sodium (COLACE) 100 MG capsule Take 100 mg by mouth daily.   fenofibrate 160 MG tablet Take 160 mg by mouth daily.   glucose blood (ACCU-CHEK GUIDE) test strip 1 each by Other route as needed for other. Use as instructed   hydrocortisone (ANUSOL-HC) 2.5 % rectal cream Place 1 application rectally 2 (two) times daily.   liraglutide (VICTOZA) 18 MG/3ML SOPN Inject 1.8 mg into the skin daily.   lisinopril-hydrochlorothiazide (ZESTORETIC) 20-25  MG tablet Take 1 tablet by mouth daily.   metFORMIN (GLUCOPHAGE) 1000 MG tablet Take 1 tablet (1,000 mg total) by mouth 2 (two) times daily with a meal.   nystatin-triamcinolone ointment (MYCOLOG) Apply 1 application topically 2 (two) times daily.   rosuvastatin (CRESTOR) 5 MG tablet Take 5 mg by mouth daily.   TRESIBA FLEXTOUCH 100 UNIT/ML FlexTouch Pen INJECT 24 UNITS INTO THE SKIN DAILY.   Past Medical History:  Diagnosis Date   Headache    hx migraines none in years   Heart murmur, systolic    Negative echocardiogram 2016   Hypertension    Obesity    Prolapse of 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    Umbilical hernia    Uncontrolled type 2 diabetes mellitus with hyperglycemia, with long-term current use of insulin ([facility]) 11/29/2019   Past Surgical History:  Procedure Laterality Date   CHOLECYSTECTOMY     COLONOSCOPY WITH PROPOFOL N/A 05/31/2016     Procedure: COLONOSCOPY WITH PROPOFOL;  Surgeon: Martin K Johnson, MD;  Location: [facility];  Service: Endoscopy;  Laterality: N/A;   Social History   Social History Narrative   Patient is married with 3 sons   Never smoker, no alcohol   2 caffeinated beverages daily   No drug use no other tobacco   family history is not on file. She was adopted.   Review of Systems As per HPI, night sweats some decreased vision reported.  All other review of systems are negative.  Objective:   Physical Exam @BP (!) 160/68   Pulse 76   Ht 5' 10" (1.778 m)   Wt 230 lb (104.3 kg)   LMP  (LMP Unknown)   BMI 33.00 kg/m @  General:  Obese ww and in no acute distress Eyes:  anicteric.  Lungs: Clear to auscultation bilaterally. Heart:   S1S2, 2/6 RUSB/LUSB murmur. Abdomen:  Obese, soft and NT taped - large diastasi recti and baseball size umbilical/periumbilical hernia   Rectal:   Patti Jordan, CMA present.  The perianal area is extremely erythematous with some satellite lesions and moderate firm perianal tags.        Digital exam is tender  mildly, indurated somewhat long anal canal.  There is a definite rectocele palpated.  No mass.  Stool is loose in consistency with some chunks probably Bristol score 4.  There is weak external anal resting tone and voluntary squeeze.  There is increased recruitment of the gluteal muscles to try to tighten the anal sphincter.  Simulated defecation shows an appropriate abdominal contraction and descent and relaxation.  No signs of dyssynergia.   Anoscopy - inflamed grade 1 internal and external hemorrhoids, fair amount of stool with some mucoid discharge question if the rectal mucosa is inflamed difficult to see   Skin  perianal rash Neuro:  A&O x 3.  Psych:  I detect a somewhat flat affect  Data Reviewed: See HPI I have also reviewed physical therapy notes endocrinology note of September 2021  

## 2020-10-08 NOTE — Patient Instructions (Addendum)
You have been scheduled for a colonoscopy. Please follow written instructions given to you at your visit today.  Please pick up your prep supplies at the pharmacy within the next 1-3 days. If you use inhalers (even only as needed), please bring them with you on the day of your procedure.  If you are age 55 or older, your body mass index should be between 23-30. Your Body mass index is 33 kg/m. If this is out of the aforementioned range listed, please consider follow up with your Primary Care Provider.  If you are age 31 or younger, your body mass index should be between 19-25. Your Body mass index is 33 kg/m. If this is out of the aformentioned range listed, please consider follow up with your Primary Care Provider.   __________________________________________________________  The [facility] providers would like to encourage you to use [facility] to communicate with providers for non-urgent requests or questions.  Due to long hold times on the telephone, sending your provider a message by [facility] may be a faster and more efficient way to get a response.  Please allow 48 business hours for a response.  Please remember that this is for non-urgent requests.   We have sent the following medications to your pharmacy for you to pick up at your convenience: Forest Gleason  We are going to request your lab results from [facility] for review.  I appreciate the opportunity to care for you. Silvano Rusk, MD, [facility]

## 2020-10-09 DIAGNOSIS — K429 Umbilical hernia without obstruction or gangrene: Secondary | ICD-10-CM | POA: Insufficient documentation

## 2020-10-09 DIAGNOSIS — L309 Dermatitis, unspecified: Secondary | ICD-10-CM | POA: Insufficient documentation

## 2020-10-09 DIAGNOSIS — M6208 Separation of muscle (nontraumatic), other site: Secondary | ICD-10-CM | POA: Insufficient documentation

## 2020-10-19 HISTORY — PX: COLONOSCOPY: SHX174

## 2020-10-27 ENCOUNTER — Encounter: Payer: BC Managed Care – PPO | Attending: Obstetrics and Gynecology | Admitting: Physical Therapy

## 2020-10-27 ENCOUNTER — Encounter: Payer: Self-pay | Admitting: Physical Therapy

## 2020-10-27 ENCOUNTER — Other Ambulatory Visit: Payer: Self-pay

## 2020-10-27 DIAGNOSIS — R278 Other lack of coordination: Secondary | ICD-10-CM | POA: Insufficient documentation

## 2020-10-27 DIAGNOSIS — M6281 Muscle weakness (generalized): Secondary | ICD-10-CM | POA: Insufficient documentation

## 2020-10-27 NOTE — Therapy (Signed)
[place] at [facility] [address], [place], [place], [address] Phone: [phone]   Fax:  [phone]  Physical Therapy Treatment  Patient Details  Name: Kristine Callahan MRN: [id number] Date of Birth: 01/03/1966 Referring Provider (PT): Dr. Sherlene Shams   Encounter Date: 10/27/2020   PT End of Session - 10/27/20 1305     Visit Number 4    Date for PT Re-Evaluation 01/15/21    Authorization Type BC[employer]    PT Start Time 1130    PT Stop Time 1220    PT Time Calculation (min) 50 min    Activity Tolerance Patient tolerated treatment well;No increased pain    Behavior During Therapy [facility] for tasks assessed/performed             Past Medical History:  Diagnosis Date   Headache    hx migraines none in years   Heart murmur, systolic    Negative echocardiogram 2016   Hypertension    Obesity    Prolapse of 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    Umbilical hernia    Uncontrolled type 2 diabetes mellitus with hyperglycemia, with long-term current use of insulin ([place]) 11/29/2019    Past Surgical History:  Procedure Laterality Date   CHOLECYSTECTOMY     COLONOSCOPY WITH PROPOFOL N/A 05/31/2016   Procedure: COLONOSCOPY WITH PROPOFOL;  Surgeon: Garlan Fair, MD;  Location: [facility];  Service: Endoscopy;  Laterality: N/A;    There were no vitals filed for this visit.   Subjective Assessment - 10/27/20 1134     Subjective Dr. Carlean Purl gave me creams to help in the perineal area and it is helping. Colonoscopy next thursday. I caught and was sick for 12 days. The tape helps with the bulging.    Patient Stated Goals reduce the pressure and reduce the bowel issues    Currently in Pain? Yes    Pain Score 4     Pain Location Rectum    Pain Orientation Mid    Pain Descriptors / Indicators Pressure    Pain Type Chronic pain    Pain Onset More than a month ago    Pain Frequency Intermittent    Aggravating Factors  not sure    Pain  Relieving Factors after being on the commode                [facility] PT Assessment - 10/27/20 0001       Assessment   Medical Diagnosis R15.9 Full incontinence of feces; N39.3 Stress incontinence    Referring Provider (PT) Dr. Sherlene Shams    Onset Date/Surgical Date --   past 2 years   Prior Therapy none      Precautions   Precautions None      Restrictions   Weight Bearing Restrictions No      Home Environment   Living Environment Private residence      Prior Function   Level of Independence Independent    Vocation Other (comment)   does not work   Leisure trying to get back inot walking, tried jogging but leak out of rectum, tries some wt. lifting for 4 times per week      Cognition   Overall Cognitive Status Within Functional Limits for tasks assessed      Posture/Postural Control   Posture/Postural Control No significant limitations      AROM   Overall AROM Comments lumbar ROM is full      Strength   Right Hip ABduction  4-/5    Left Hip ABduction 4-/5                           [facility] Adult PT Treatment/Exercise - 10/27/20 0001       Self-Care   Self-Care Other Self-Care Comments    Other Self-Care Comments  education on how to manage her prolapse, khealthy diet, exercise, and eduction on different prolapses      Therapeutic Activites    Therapeutic Activities Other Therapeutic Activities    Other Therapeutic Activities education on correct toileting with breath, knees above the hips, relaxation of the anus and using the squatty poty      Lumbar Exercises: Supine   Ab Set 10 reps;1 second    AB Set Limitations with ball squeeze and difficutly feeling the abdominals contracting      Lumbar Exercises: Sidelying   Other Sidelying Lumbar Exercises sidley with pillow between knees and press ball into mat to engage the abdominals      Manual Therapy   Manual Therapy Taping    Kinesiotex Facilitate Muscle   taped laying down flat, one  strip to lift by the round ligament     Kinesiotix   Facilitate Muscle  along the midline of the abdomen to reduce the diastasis recti and increase abdominal tension                    PT Education - 10/27/20 1225     Education Details Access Code: [email or web address]  ; education on toileting, education on prolapse; helped patient set up on My Chart    Person(s) Educated Patient    Methods Explanation;Demonstration;Verbal cues;Handout    Comprehension Returned demonstration;Verbalized understanding              PT Short Term Goals - 10/27/20 1318       PT SHORT TERM GOAL #1   Title independent with diaphgramatic breathing, bracing of the abdomen with minimal doming, and  low level pelvic floor contraction    Time 4    Period Weeks    Status Achieved      PT SHORT TERM GOAL #2   Title education on how to manage prolapse and vulvar care    Time 4    Period Weeks    Status Achieved      PT SHORT TERM GOAL #3   Title educated on correct toileting techniques to reduce strain on abdomen and pelvic floor    Time 4    Period Weeks    Status Achieved      PT SHORT TERM GOAL #4   Title education on vaginal moisturizers and lubricants for vaginal health    Time 4    Period Weeks    Status Achieved               PT Long Term Goals - 09/15/20 1355       PT LONG TERM GOAL #1   Title independent with advanced HEP for core strength and pelvic floor strength    Time 4    Period Months    Status New    Target Date 01/15/21      PT LONG TERM GOAL #2   Title straining to have a bowel movement decreased >/= 50% due to improve strength and coordination of the pelvic floor    Time 4    Period Months    Status New    Target Date  01/15/21      PT LONG TERM GOAL #3   Title fecal leakage decreased >/= 60% due to increased pelvic floro tone and strength >/= 3/5 holding for 10 seconds    Time 4    Period Months    Status New    Target Date 01/15/21      PT LONG TERM  GOAL #4   Title able to reduce gas leakage >/= 60% due to improve coordination and control of the pelvic floor muscles    Time 4    Period Months    Status New    Target Date 01/15/21                   Plan - 10/27/20 1306     Clinical Impression Statement Patient reports she fecal leakage is 50% better. She is able to ahve a full sformed stoll the past 2 days. She is leaking smaller amounts when it happens. Patient is feeling increased abdominal tone. She continues to make progress when we tape her abdominals to reduce the diastasis. She will bulge her abdominal midline when moving on the mat but is not as wide. Patient reports her perineal and anal area is looking better after Dr. Carlean Purl has prescribed her the cream. She will be having a colonoscopy on Thursday and See Dr. Wannetta Sender soon. Patient will benefit from skilled therapy to reduce her leakage, improve quality of abdominal and pelvic floor contraction to reduce urinary and fecal leakage.    Personal Factors and Comorbidities Age;Sex;Comorbidity 3+;Fitness;Past/Current Experience;Time since onset of injury/illness/exacerbation    Comorbidities Hysterectomy; Diabetes; umbilical hernia    Examination-Activity Limitations Bed Mobility;Carry;Continence;Lift;Toileting    Examination-Participation Restrictions Cleaning;Community Activity;Interpersonal Relationship;Laundry    Stability/Clinical Decision Making Evolving/Moderate complexity    Rehab Potential Good    PT Frequency 1x / week    PT Duration Other (comment)   4 months   PT Treatment/Interventions ADLs/Self Care Home Management;Electrical Stimulation;Cryotherapy;Moist Heat;Therapeutic activities;Therapeutic exercise;Neuromuscular re-education;Patient/family education;Manual techniques;Taping;Spinal Manipulations    PT Next Visit Plan tape abdominals if needed, ; abdominal contraction with arm movement; see how colonoscopy went; go over the urge to void    PT Home Exercise  Plan Access Code: [email or web address]    Consulted and Agree with Plan of Care Patient             Patient will benefit from skilled therapeutic intervention in order to improve the following deficits and impairments:  Decreased endurance, Impaired tone, Decreased activity tolerance, Decreased strength, Increased fascial restricitons, Pain, Increased muscle spasms, Decreased coordination  Visit Diagnosis: Muscle weakness (generalized)  Other lack of coordination     Problem List Patient Active Problem List   Diagnosis Date Noted   Umbilical hernia without obstruction and without gangrene 10/09/2020   Diastasis recti 10/09/2020   Perianal dermatitis 10/09/2020   Type 2 diabetes mellitus with diabetic polyneuropathy, without long-term current use of insulin ([place]) 11/29/2019   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) 11/29/2019   Dyslipidemia 11/29/2019    Earlie Counts, PT 10/27/20 1:21 PM  [facility] [address] [place], [place], [address] Phone: [phone]   Fax:  [phone]  Name: Kristine Callahan MRN: [id number] Date of Birth: [birth year]

## 2020-10-27 NOTE — Patient Instructions (Addendum)
About Pelvic Support Problems Pelvic Support Problems Explained Ligaments, muscles, and connective tissue normally hold your bladder, uterus, and other organs in their proper places in your pelvis. When these tissues become weak, a problem with pelvic support may result. Weak support can cause one or more of the pelvic organs to drop down into the vagina. An organ may even drop so far that is partially exposed outside the body.  Pelvic support problems are named by the change in the organ. The main types of pelvic support problems are:  Cystocele: When the bladder drops down into your vagina.  Enterocele: When your small intestine drops between your vagina and rectum.  Rectocele: When your rectum bulges into the vaginal wall.  Uterine prolapse: When your uterus drops into your vagina.  Vaginal prolapse: When the top part of the vagina begins to droop. This sometimes happens after a hysterectomy (removal of the uterus).  Causes Pelvic support problems can be caused by many conditions. They may begin after you give birth, especially if you had a large baby. During childbirth, the muscles and skin of the birth canal (vagina) are stretched and sometimes torn. They heal over time but are not always exactly the same. A long pushing stage of labor may also weaken these tissues as well as very rapid births as the tissues do not have time to stretch so they tear.  Also, after menopause, there are changes in the vaginal walls resulting from a decrease in estrogen. Estrogen helps to keep the tissues toned. Low levels of estrogen weaken the vaginal walls and may cause the bladder to shift from its normal position. As women get older, the loss of muscle tone and the relaxation of muscles may cause the uterus or other organs to drop.  Over time, conditions like chronic coughing, chronic constipation, doing a lot of heavy lifting, straining to pass stool, and obesity, can also weaken the pelvic support muscles.   Diagnosing Pelvic Support Problems Your health care provider will ask about your symptoms and do a pelvic examination. Your provider may also do a rectal exam during your pelvic exam. Your provider may ask you to: 1. Bear down and push (like you are having a bowel movement) so he or she can see if your bladder or other part of your body protrudes into the vagina. 2. Contract the muscles of your pelvis to check the strength of your pelvic muscles.  3. Do several types of urine, nerve and muscle tests of the pelvis and around the bladder to see what type of treatment is best for you.   Symptoms Symptoms of pelvic support problems depend on the organ involved, but may include:  urine leakage  stain or fecal loss after a bowel movement trouble having bowel movements  ache in the lower abdomen, groin, or lower back  bladder infection  a feeling of heaviness, pulling, or fullness in the pelvis, or a feeling that something is falling out of the vagina  an organ protruding from your vaginal opening  feeling the need to support the organs or perineal area to empty bladder or bowels painful sexual intercourse.  Many women feel pelvic pressure or trouble holding their urine immediately after childbirth. For some, these symptoms go away permanently, in others they return as they get older.  Treatment Options A prolapsed organ cannot repair itself. Contact your health care provider as soon as you notice symptoms of a problem. Treatment depends on what the specific problem is and how far  advanced it is.  The symptoms caused by some pelvic support problems may simply be treated with changes in diet, medicine to soften the stool, weight loss, or avoiding strenuous activities. You may also do pelvic floor exercises to help strengthen your pelvic muscles.  Some cases of prolapse may require a special support device made from plastic or rubber called a pessary that fits into the vagina to support the uterus,  vagina, or bladder. A pessary can also help women who leak urine when coughing, straining, or exercising. In mild cases, a tampon or vaginal diaphragm may be used instead of a pessary.  Talk to your doctor or health care provider about these options. In serious cases, surgery may be needed to put the organs back into their proper place. The uterus may be removed because of the pressure it puts on the bladder.  Your doctor will know what surgery will be best for you. How can I prevent pelvic support problems?  You can help prevent pelvic support problems by:  maintaining a healthy lifestyle  continuing to do pelvic floor exercises after you deliver a baby  maintaining a healthy weight  avoiding a lot of heavy lifting and lifting with your legs (not from your waist)  treating constipation and avoid getting    Walking 15 minutes 2 times per day  Toileting Techniques for Bowel Movements    An Evacuation/Defecation Plan   Here are the 4 basic points:  Lean forward enough for your elbows to rest on your knees Support your feet on the floor or use a low stool if your feet don't touch the floor  Push out your belly as if you have swallowed a beach ball--you should feel a widening of your waist. "Belly Big, Belly Hard" Open and relax your pelvic floor muscles, rather than tightening around the anus  While you are sitting on the toilet pay attention to the following areas: Jaw and mouth position- relaxed not clenched Angle of your hips - leaning slightly forward Whether your feet touch the ground or not - should be flat and supported Arm placement - rest against your thighs Spine position - flat back Waist Breathing - exhale as you push (like blowing up a balloon or try using other sounds such as ahhhh, shhhhh, ohhhh or grrrrrrr) Kristine Callahan - hard and tight as you push Anus (opening of the anal canal) - relaxed and open as you push Anus - Tighten and lift pulling the muscle back in after you are  done or if taking a break  If you are not successful after 10-15 minutes, try again later.  Avoid negative self-talk about your toileting experience.   Read this for more details and ask your PT if you need suggestions for adjustments or limitations:  Sitting on the toilet  a) Make sure your feet are supported - flat on the floor or step stool b) Many people find it effective to lean forward or raise their knees.  Propping your feet on a step stool (Squatty Potty is a brand name) can help the muscles around the anus to relax  c) When you lean forward, place your forearms on your thighs for support  Relaxing Breathe deeply and slowly in through your nose and out through your mouth. To become aware of how to relax your muscles, contracting and releasing muscles can be helpful.  Pull your pelvic floor muscles in tightly by using the image of holding back gas, or closing around the anus (visualize making  a circle smaller) and lifting the anus up and in.  Then release the muscles and your anus should drop down and feel open. Repeat 5 times ending with the feeling of relaxation. Keep your pelvic floor muscles relaxed; let your belly bulge out. The digestive tract starts at the mouth and ends at the anal opening, so be sure to relax both ends of the tube.  Place your tongue on the roof of your mouth with your teeth separated.  This helps relax your mouth and will help to relax the anus at the same time.  Emptying (defecation) a) Keep your pelvic floor and sphincter relaxed, then bulge your anal muscles.  Make the anal opening wide.  b) Stick your belly out as if you have swallowed a beach ball. c) Make your belly wall hard using your belly muscles while continuing to breathe. Doing this makes it easier to open your anus. d) Breath out and give a grunt (or try using other sounds such as ahhhh, shhhhh, ohhhh or grrrrrrr). e)  Can also try to act as if you are blowing up a balloon as you  push  4) Finishing a) As you finish your bowel movement, pull the pelvic floor muscles up and in.  This will leave your anus in the proper place rather than remaining pushed out and down. If you leave your anus pushed out and down, it will start to feel as though that is normal and give you incorrect signals about needing to have a bowel movement.   Access Code: [email or web address] URL: [email or web address] Date: 10/27/2020 Prepared by: Kristine Callahan  Exercises Hooklying Transversus Abdominis Palpation - 3 x daily - 7 x weekly - 1 sets - 10 reps Cat Cow - 1 x daily - 7 x weekly - 1 sets - 15 reps Child's Pose Stretch - 1 x daily - 7 x weekly - 1 sets - 2 reps - 30 sec hold Sidelying Open Book Thoracic Lumbar Rotation and Extension - 1 x daily - 7 x weekly - 1 sets - 10 reps Sidelying Reverse Clamshell - 1 x daily - 7 x weekly - 1 sets - 10 reps Sit to Stand - 1 x daily - 7 x weekly - 1 sets - 10 reps Sidelying Hip Abduction - 1 x daily - 7 x weekly - 1 sets - 10 reps Quadruped Hip Abduction and External Rotation - 1 x daily - 3 x weekly - 1 sets - 5 reps Quadruped Bent Leg Hip Extension - 1 x daily - 3 x weekly - 1 sets - 5 reps Sidelying Hip Adduction Isometric with Ball - 1 x daily - 7 x weekly - 2 sets - 10 reps - 5 sec hold  Kristine Callahan, PT [facility] [address] [place], [place] [address] W: [phone] [email or web address]

## 2020-10-28 ENCOUNTER — Ambulatory Visit: Payer: BC Managed Care – PPO | Admitting: Internal Medicine

## 2020-10-29 ENCOUNTER — Encounter: Payer: Self-pay | Admitting: Obstetrics and Gynecology

## 2020-10-29 ENCOUNTER — Ambulatory Visit (INDEPENDENT_AMBULATORY_CARE_PROVIDER_SITE_OTHER): Payer: BC Managed Care – PPO | Admitting: Obstetrics and Gynecology

## 2020-10-29 ENCOUNTER — Other Ambulatory Visit: Payer: Self-pay

## 2020-10-29 VITALS — BP 172/76 | HR 78 | Wt 230.0 lb

## 2020-10-29 DIAGNOSIS — R3915 Urgency of urination: Secondary | ICD-10-CM

## 2020-10-29 LAB — POCT URINALYSIS DIPSTICK
Appearance: NORMAL
Bilirubin, UA: NEGATIVE
Glucose, UA: POSITIVE — AB
Ketones, UA: NEGATIVE
Leukocytes, UA: NEGATIVE
Nitrite, UA: NEGATIVE
Protein, UA: NEGATIVE
Spec Grav, UA: 1.025 (ref 1.010–1.025)
Urobilinogen, UA: 0.2 E.U./dL
pH, UA: 5 (ref 5.0–8.0)

## 2020-10-29 NOTE — Progress Notes (Addendum)
[facility] Urodynamics Procedure  Referring Physician: Leeroy Cha,* Date of Procedure: 10/29/2020  Kristine Callahan is a 55 y.o. female who presents for urodynamic evaluation. Indication(s) for study: mixed incontinence  Vital Signs: BP (!) 172/76   Pulse 78   Wt 230 lb (104.3 kg)   LMP  (LMP Unknown)   BMI 33.00 kg/m   Laboratory Results: A catheterized urine specimen revealed:  POC urine: trace blood   Voiding Diary: Not performed  Procedure Timeout:  The correct patient was verified and the correct procedure was verified. The patient was in the correct position and safety precautions were reviewed based on at the patient's history.  Urodynamic Procedure A 54F dual lumen urodynamics catheter was placed under sterile conditions into the patient's bladder. A 54F catheter was placed into the rectum in order to measure abdominal pressure. EMG patches were placed in the appropriate position.  All connections were confirmed and calibrations/adjusted made. Saline was instilled into the bladder through the dual lumen catheters.  Cough/valsalva pressures were measured periodically during filling.  Patient was allowed to void.  The bladder was then emptied of its residual.  UROFLOW: Revealed a Qmax of 17.3 mL/sec.  She voided 84 mL and had a residual of 50 mL.  It was a normal pattern and represented normal habits though interpretation limited due to low voided volume.  CMG: This was performed with sterile water in the sitting position at a fill rate of 30 mL/min.    First sensation of fullness was 60 mLs,  First urge was 188 mLs,  Strong urge was 347 mLs and  Capacity was 675 mLs  Stress incontinence was not demonstrated Highest negative Barrier CLPP was 104 cmH20 at 536 ml. Highest negative Barrier VLPP was 89 cmH20 at 536 ml.  Detrusor function was normal, with no phasic contractions seen.    Compliance:  normal. End fill detrusor pressure was 3.8cmH20.   Calculated compliance was 116m/cmH20  UPP: MUCP with/ without barrier reduction was 36 cm of water.    MICTURITION STUDY: Voiding was performed with reduction using scopettes in the sitting position.  Pdet at Qmax was 29.6 cm of water.  Qmax was 37.9 mL/sec.  It was a normal pattern.  She voided 640.9 mL and had a residual of 35 mL.  It was a volitional void, sustained detrusor contraction was present and abdominal straining was present  EMG: This was performed with patches.  She had voluntary contractions, recruitment with fill was not present and urethral sphincter was relaxed with void.  The details of the procedure with the study tracings have been scanned into EPIC.   Urodynamic Impression:  1. Sensation was normal; capacity was normal 2. Stress Incontinence was not demonstrated; 3. Detrusor Overactivity was not demonstrated. 4. Emptying was normal with a normal PVR, a sustained detrusor contraction present,  abdominal straining not present, normal urethral sphincter activity on EMG.  Plan: - The patient will follow up  to discuss the findings and treatment options.   MJaquita Folds MD

## 2020-10-29 NOTE — Patient Instructions (Signed)

## 2020-11-03 ENCOUNTER — Encounter: Payer: Self-pay | Admitting: Physical Therapy

## 2020-11-03 ENCOUNTER — Encounter: Payer: BC Managed Care – PPO | Admitting: Physical Therapy

## 2020-11-03 ENCOUNTER — Other Ambulatory Visit: Payer: Self-pay

## 2020-11-03 DIAGNOSIS — M6281 Muscle weakness (generalized): Secondary | ICD-10-CM

## 2020-11-03 DIAGNOSIS — R278 Other lack of coordination: Secondary | ICD-10-CM

## 2020-11-03 NOTE — Patient Instructions (Signed)
Access Code: [email or web address] URL: [email or web address] Date: 11/03/2020 Prepared by: Earlie Counts  Program Notes lay on side, breath out as you slide your hand down your leg 15x    Exercises Hooklying Transversus Abdominis Palpation - 3 x daily - 7 x weekly - 1 sets - 10 reps Cat Cow - 1 x daily - 7 x weekly - 1 sets - 15 reps Child's Pose Stretch - 1 x daily - 7 x weekly - 1 sets - 2 reps - 30 sec hold Sidelying Open Book Thoracic Lumbar Rotation and Extension - 1 x daily - 3 x weekly - 1 sets - 10 reps Sidelying Reverse Clamshell - 1 x daily - 3 x weekly - 1 sets - 10 reps Sit to Stand - 1 x daily - 7 x weekly - 1 sets - 10 reps Sidelying Hip Abduction - 1 x daily - 7 x weekly - 1 sets - 10 reps Quadruped Hip Abduction and External Rotation - 1 x daily - 3 x weekly - 1 sets - 5 reps Quadruped Bent Leg Hip Extension - 1 x daily - 3 x weekly - 1 sets - 5 reps Sidelying Hip Adduction Isometric with Ball - 1 x daily - 7 x weekly - 2 sets - 10 reps - 5 sec hold Side Plank on Knees - 1 x daily - 3 x weekly - 1 sets - 10 reps Earlie Counts, PT [facility] [address], [place], [place] [address] W: [phone] [email or web address]

## 2020-11-03 NOTE — Therapy (Signed)
[place] at [facility] [address], [place], [place], [address] Phone: [phone]   Fax:  [phone]  Physical Therapy Treatment  Patient Details  Name: Kristine Callahan MRN: [id number] Date of Birth: [birth year] Referring Provider (PT): Dr. Sherlene Shams   Encounter Date: 11/03/2020   PT End of Session - 11/03/20 1256     Visit Number 5    Date for PT Re-Evaluation 01/15/21    Authorization Type BC[employer]    PT Start Time 1130    PT Stop Time 1220    PT Time Calculation (min) 50 min    Activity Tolerance Patient tolerated treatment well;No increased pain    Behavior During Therapy [facility] for tasks assessed/performed             Past Medical History:  Diagnosis Date   Headache    hx migraines none in years   Heart murmur, systolic    Negative echocardiogram 2016   Hypertension    Obesity    Prolapse of 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    Umbilical hernia    Uncontrolled type 2 diabetes mellitus with hyperglycemia, with long-term current use of insulin ([place]) 11/29/2019    Past Surgical History:  Procedure Laterality Date   CHOLECYSTECTOMY     COLONOSCOPY WITH PROPOFOL N/A 05/31/2016   Procedure: COLONOSCOPY WITH PROPOFOL;  Surgeon: Garlan Fair, MD;  Location: [facility];  Service: Endoscopy;  Laterality: N/A;    There were no vitals filed for this visit.   Subjective Assessment - 11/03/20 1132     Subjective Bladder is doing well. I will schedule for a uterine and rectal prolapse. I see her on 8/26. I have the colonoscopy on this Thursday. The rash is still doing well with the cream. I have not been going to the bathroom the same since I was sick. the fecal leakage is 70%. I am not as afraid to do things now due to less leakage. Patient has not had rectal pain in the last few days. My stomach feels smoother and firmer. No urine leakage.    Patient Stated Goals No change with the pressure.    Currently in Pain? No/denies                             Pelvic Floor Special Questions - 11/03/20 0001     Diastasis Recti umbilical hernia; 4 finger width above the umbilicus, 2 fingers wide 7 inches above umbilicus 2 fingers width  1 fingers below the umbilicus               [facility] Adult PT Treatment/Exercise - 11/03/20 0001       Lumbar Exercises: Sidelying   Other Sidelying Lumbar Exercises side plank with lifting hips up to engage the obliques 10x each; sidely and reach hands to knees to engage the obliques 10x      Manual Therapy   Manual Therapy Soft tissue mobilization;Taping    Soft tissue mobilization manual work to the abdomen to release fascial restrictions; circular motion of the abdomen to assist with peristalic motion of the intestines and assess the areas of tenderness    Kinesiotex Facilitate Muscle   taped laying down flat, one strip to lift by the round ligament     Kinesiotix   Facilitate Muscle  along the midline of the abdomen to reduce the diastasis recti and increase abdominal tension  PT Education - 11/03/20 1256     Education Details Access Code: [email or web address]    Person(s) Educated Patient    Methods Explanation;Demonstration;Verbal cues;Handout    Comprehension Returned demonstration;Verbalized understanding              PT Short Term Goals - 10/27/20 1318       PT SHORT TERM GOAL #1   Title independent with diaphgramatic breathing, bracing of the abdomen with minimal doming, and  low level pelvic floor contraction    Time 4    Period Weeks    Status Achieved      PT SHORT TERM GOAL #2   Title education on how to manage prolapse and vulvar care    Time 4    Period Weeks    Status Achieved      PT SHORT TERM GOAL #3   Title educated on correct toileting techniques to reduce strain on abdomen and pelvic floor    Time 4    Period Weeks    Status Achieved      PT SHORT TERM GOAL #4   Title education on vaginal  moisturizers and lubricants for vaginal health    Time 4    Period Weeks    Status Achieved               PT Long Term Goals - 11/03/20 1138       PT LONG TERM GOAL #1   Title independent with advanced HEP for core strength and pelvic floor strength    Time 4    Period Months    Status On-going      PT LONG TERM GOAL #2   Title straining to have a bowel movement decreased >/= 50% due to improve strength and coordination of the pelvic floor    Baseline the stool is loose and liquidly    Time 4    Period Months    Status On-going      PT LONG TERM GOAL #3   Title fecal leakage decreased >/= 60% due to increased pelvic floro tone and strength >/= 3/5 holding for 10 seconds    Baseline 70% better    Time 4    Period Months    Status On-going      PT LONG TERM GOAL #4   Title able to reduce gas leakage >/= 60% due to improve coordination and control of the pelvic floor muscles    Baseline when haing gas some clear and brown liquid comes out    Time 4    Period Months    Status On-going                   Plan - 11/03/20 1257     Clinical Impression Statement Patient reports 70% less leakage with stool and liquid coming out when she passes gas. Patient has not had rectal pain in the last few days. Patient reports she has not had many formed stool since she had the intestinal virus. When she has gas a clear and brown liquid comes out but that is 70% less. Patient is able to engage her lower abdominals for the first time. She feels like her stomach has tone. She has less of diastasis recti above and below the umbilicus. The umbilical hernal is the same size. Patient is learning to engage her obliques to assis with abdominal contraction. Patient contiunes to do well with the taping of her abdomen. Patient will benefit from skilled therapy to  reduce her leakage, improve quality of abdominal and pelvic floor contraciton.    Personal Factors and Comorbidities  Age;Sex;Comorbidity 3+;Fitness;Past/Current Experience;Time since onset of injury/illness/exacerbation    Comorbidities Hysterectomy; Diabetes; umbilical hernia    Examination-Activity Limitations Bed Mobility;Carry;Continence;Lift;Toileting    Examination-Participation Restrictions Cleaning;Community Activity;Interpersonal Relationship;Laundry    Stability/Clinical Decision Making Evolving/Moderate complexity    Rehab Potential Good    PT Frequency 1x / week    PT Duration Other (comment)   4 months   PT Treatment/Interventions ADLs/Self Care Home Management;Electrical Stimulation;Cryotherapy;Moist Heat;Therapeutic activities;Therapeutic exercise;Neuromuscular re-education;Patient/family education;Manual techniques;Taping;Spinal Manipulations    PT Next Visit Plan tape abdominals ; abdominal contraction with arm movement; see how colonoscopy went;    PT Home Exercise Plan Access Code: [email or web address]    Consulted and Agree with Plan of Care Patient             Patient will benefit from skilled therapeutic intervention in order to improve the following deficits and impairments:  Decreased endurance, Impaired tone, Decreased activity tolerance, Decreased strength, Increased fascial restricitons, Pain, Increased muscle spasms, Decreased coordination  Visit Diagnosis: Muscle weakness (generalized)  Other lack of coordination     Problem List Patient Active Problem List   Diagnosis Date Noted   Umbilical hernia without obstruction and without gangrene 10/09/2020   Diastasis recti 10/09/2020   Perianal dermatitis 10/09/2020   Type 2 diabetes mellitus with diabetic polyneuropathy, without long-term current use of insulin ([place]) 11/29/2019   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

## 2020-11-05 ENCOUNTER — Encounter: Payer: Self-pay | Admitting: Internal Medicine

## 2020-11-05 ENCOUNTER — Other Ambulatory Visit: Payer: Self-pay

## 2020-11-05 ENCOUNTER — Ambulatory Visit (AMBULATORY_SURGERY_CENTER): Payer: BC Managed Care – PPO | Admitting: Internal Medicine

## 2020-11-05 VITALS — BP 122/74 | HR 80 | Temp 97.4°F | Resp 12 | Ht 70.0 in | Wt 230.0 lb

## 2020-11-05 DIAGNOSIS — K6289 Other specified diseases of anus and rectum: Secondary | ICD-10-CM | POA: Diagnosis not present

## 2020-11-05 DIAGNOSIS — K5730 Diverticulosis of large intestine without perforation or abscess without bleeding: Secondary | ICD-10-CM | POA: Diagnosis not present

## 2020-11-05 DIAGNOSIS — K625 Hemorrhage of anus and rectum: Secondary | ICD-10-CM

## 2020-11-05 DIAGNOSIS — R194 Change in bowel habit: Secondary | ICD-10-CM

## 2020-11-05 DIAGNOSIS — K5190 Ulcerative colitis, unspecified, without complications: Secondary | ICD-10-CM | POA: Diagnosis not present

## 2020-11-05 MED ORDER — FLEET ENEMA 7-19 GM/118ML RE ENEM
1.0000 | ENEMA | Freq: Once | RECTAL | Status: AC
  Administered 2020-11-05: 1 via RECTAL

## 2020-11-05 MED ORDER — GERHARDT'S BUTT CREAM: TOPICAL_CREAM | CUTANEOUS | 3 refills | Status: DC

## 2020-11-05 MED ORDER — SODIUM CHLORIDE 0.9 % IV SOLN: 500.0000 mL | Freq: Once | INTRAVENOUS | Status: DC

## 2020-11-05 NOTE — Progress Notes (Signed)
Called to room to assist during endoscopic procedure.  Patient ID and intended procedure confirmed with present staff. Received instructions for my participation in the procedure from the performing physician.  

## 2020-11-05 NOTE — Progress Notes (Signed)
History and Physical Interval Note:  11/05/2020 10:08 AM  Kristine Callahan  has presented today for endoscopic procedure(s), with the diagnosis of  Encounter Diagnosis  Name Primary?   Change in bowel habits Yes  .  The various methods of evaluation and treatment have been discussed with the patient and/or family. After consideration of risks, benefits and other options for treatment, the patient has consented to  the endoscopic procedure(s).   The patient's history has been reviewed, patient examined, no change in status, stable for surgery.  I have reviewed the patient's chart and labs.  Questions were answered to the patient's satisfaction.     Gatha Mayer, MD, [facility]

## 2020-11-05 NOTE — Patient Instructions (Addendum)
It looks like you have a chronic rectal inflammation. 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  I took biopsies and we will see what that tells [place]. There are treatments for this - I need to see pathology and discuss with you after.  I could not see very well overall so you will need a repeat exam at some point within a year to more carefully inspect the colon.  There is some diverticulosis also.  I am giving a prescription for a different cream for the rash.  I will be in touch in a week or so after I see biopsy results. I think I can help you get relief.  I appreciate the opportunity to care for you.  Gatha Mayer, MD, [facility]   Handouts given for Ulcerative Colitis and Diverticulosis.   YOU HAD AN ENDOSCOPIC PROCEDURE TODAY AT [facility]:   Refer to the procedure report that was given to you for any specific questions about what was found during the examination.  If the procedure report does not answer your questions, please call your gastroenterologist to clarify.  If you requested that your care partner not be given the details of your procedure findings, then the procedure report has been included in a sealed envelope for you to review at your convenience later.  YOU SHOULD EXPECT: Some feelings of bloating in the abdomen. Passage of more gas than usual.  Walking can help get rid of the air that was put into your GI tract during the procedure and reduce the bloating. If you had a lower endoscopy (such as a colonoscopy or flexible sigmoidoscopy) you may notice spotting of blood in your stool or on the toilet paper. If you underwent a bowel prep for your procedure, you may not have a normal bowel movement for a few days.  Please Note:  You might notice some irritation and congestion in your nose or some drainage.  This is from the oxygen used during your procedure.  There is no need for concern and it should clear up in a day or so.  SYMPTOMS TO REPORT  IMMEDIATELY:  Following lower endoscopy (colonoscopy):  Excessive amounts of blood in the stool  Significant tenderness or worsening of abdominal pains  Swelling of the abdomen that is new, acute  Fever of 100F or higher  For urgent or emergent issues, a gastroenterologist can be reached at any hour by calling [phone]. Do not use MyChart messaging for urgent concerns.    DIET:  We do recommend a small meal at first, but then you may proceed to your regular diet.  Drink plenty of fluids but you should avoid alcoholic beverages for 24 hours.  ACTIVITY:  You should plan to take it easy for the rest of today and you should NOT DRIVE or use heavy machinery until tomorrow (because of the sedation medicines used during the test).    FOLLOW UP: Our staff will call the number listed on your records 48-72 hours following your procedure to check on you and address any questions or concerns that you may have regarding the information given to you following your procedure. If we do not reach you, we will leave a message.  We will attempt to reach you two times.  During this call, we will ask if you have developed any symptoms of COVID 19. If you develop any symptoms (ie: fever, flu-like symptoms, shortness of breath, cough etc.) before then, please call [phone].  If you test positive for  Covid 19 in the 2 weeks post procedure, please call and report this information to [place].    If any biopsies were taken you will be contacted by phone or by letter within the next 1-3 weeks.  Please call us at [phone] if you have not heard about the biopsies in 3 weeks.    SIGNATURES/CONFIDENTIALITY: You and/or your care partner have signed paperwork which will be entered into your electronic medical record.  These signatures attest to the fact that that the information above on your After Visit Summary has been reviewed and is understood.  Full responsibility of the confidentiality of this discharge  information lies with you and/or your care-partner.

## 2020-11-05 NOTE — Op Note (Addendum)
[place] Patient Name: Kristine Callahan Procedure Date: 11/05/2020 9:53 AM MRN: [id number] Endoscopist: Gatha Mayer , MD Age: 55 Referring MD:  Date of Birth: 04-23-1965 Gender: Female Account #: [id number] Procedure:                Colonoscopy Indications:              Rectal bleeding, Change in bowel habits Medicines:                Propofol per Anesthesia, Monitored Anesthesia Care Procedure:                Pre-Anesthesia Assessment:                           - Prior to the procedure, a History and Physical                            was performed, and patient medications and                            allergies were reviewed. The patient's tolerance of                            previous anesthesia was also reviewed. The risks                            and benefits of the procedure and the sedation                            options and risks were discussed with the patient.                            All questions were answered, and informed consent                            was obtained. Prior Anticoagulants: The patient has                            taken no previous anticoagulant or antiplatelet                            agents. ASA Grade Assessment: II - A patient with                            mild systemic disease. After reviewing the risks                            and benefits, the patient was deemed in                            satisfactory condition to undergo the procedure.                           After obtaining informed consent, the colonoscope  was passed under direct vision. Throughout the                            procedure, the patient's blood pressure, pulse, and                            oxygen saturations were monitored continuously. The                            Colonoscope was introduced through the anus and                            advanced to the the terminal ileum, with                             identification of the appendiceal orifice and IC                            valve. The colonoscopy was performed with moderate                            difficulty due to poor endoscopic visualization, a                            redundant colon and significant looping. The                            patient tolerated the procedure well. The quality                            of the bowel preparation was fair. The ileocecal                            valve, appendiceal orifice, and rectum were                            photographed. Scope In: 10:13:58 AM Scope Out: 10:34:23 AM Scope Withdrawal Time: 0 hours 13 minutes 55 seconds  Total Procedure Duration: 0 hours 20 minutes 25 seconds  Findings:                 The perianal exam findings include a perianal rash.                           The digital rectal exam findings include decreased                            sphincter tone.                           Patchy inflammation characterized by altered                            vascularity, erosions, erythema, granularity and  shallow ulcerations was found in the rectum.                            Biopsies were taken with a cold forceps for                            histology. Verification of patient identification                            for the specimen was done. Estimated blood loss was                            minimal.                           Multiple diverticula were found in the sigmoid                            colon, descending colon and transverse colon.                           The terminal ileum appeared normal.                           The exam was otherwise without abnormality on                            direct and retroflexion views. Complications:            No immediate complications. Estimated Blood Loss:     Estimated blood loss was minimal. Impression:               - Preparation of the colon was fair.                            - Perianal rash found on perianal exam.                           - Decreased sphincter tone found on digital rectal                            exam.                           - Patchy inflammation was found in the rectum                            secondary to proctitis. Biopsied. had an enema so                            maybe some trauma but I think IBD though prolapse                            syndrome possible also                           -  Diverticulosis in the sigmoid colon, in the                            descending colon and in the transverse colon.                           - The examined portion of the ileum was normal. Did                            not enter but ileal mucosa at IC valve normal                           - The examination was otherwise normal on direct                            and retroflexion views. Recommendation:           - Patient has a contact number available for                            emergencies. The signs and symptoms of potential                            delayed complications were discussed with the                            patient. Return to normal activities tomorrow.                            Written discharge instructions were provided to the                            patient.                           - Resume previous diet.                           - Continue present medications.                           - Await pathology results. Treatment may be                            difficult if IBD - topical Tx not going to be easy                            given sphincter tone issues and Uceris foam is not                            on her formularty. Oral steroids will aggravate                            diabetes. May be best to purse biolofgic early                           -  Repeat colonoscopy date to be determined after                            pending pathology results are reviewed because the                             bowel preparation was poor.                           - Gerhardt's Butt cream                           cc: Dr. Sammuel Cooper, MD 11/05/2020 10:50:24 AM This report has been signed electronically.

## 2020-11-05 NOTE — Progress Notes (Signed)
Report to PACU, RN, vss, BBS= Clear.  

## 2020-11-05 NOTE — Progress Notes (Signed)
History reviewed today   Pt states that she took all of her prep, but her last bm was still dark brown liquid with no solid pieces.  Relayed prep results to Dr. Carlean Purl, and Fleet enema was ordered.  Pt instilled per self and results were still brown liquid, but Dr. Carlean Purl stated that she could go ahead with her procedure.

## 2020-11-09 ENCOUNTER — Telehealth: Payer: Self-pay

## 2020-11-09 NOTE — Telephone Encounter (Signed)
  Follow up Call-  Call back number 11/05/2020  Post procedure Call Back phone  # [phone]  Permission to leave phone message Yes  Some recent data might be hidden     Patient questions:  Do you have a fever, pain , or abdominal swelling? No. Pain Score  0 *  Have you tolerated food without any problems? Yes.    Have you been able to return to your normal activities? Yes.    Do you have any questions about your discharge instructions: Diet   No. Medications  No. Follow up visit  No.  Do you have questions or concerns about your Care? No.  Actions: * If pain score is 4 or above: No action needed, pain <4.

## 2020-11-10 ENCOUNTER — Encounter: Payer: Self-pay | Admitting: Physical Therapy

## 2020-11-10 ENCOUNTER — Other Ambulatory Visit: Payer: Self-pay

## 2020-11-10 ENCOUNTER — Encounter: Payer: BC Managed Care – PPO | Admitting: Physical Therapy

## 2020-11-10 DIAGNOSIS — M6281 Muscle weakness (generalized): Secondary | ICD-10-CM | POA: Diagnosis not present

## 2020-11-10 DIAGNOSIS — R278 Other lack of coordination: Secondary | ICD-10-CM

## 2020-11-10 NOTE — Patient Instructions (Signed)
Access Code: [email or web address] URL: [email or web address] Date: 11/10/2020 Prepared by: Earlie Counts  Program Notes lay on side, breath out as you slide your hand down your leg 15x    Exercises Hooklying Transversus Abdominis Palpation - 3 x daily - 7 x weekly - 1 sets - 10 reps Cat Cow - 1 x daily - 7 x weekly - 1 sets - 15 reps Sidelying Open Book Thoracic Lumbar Rotation and Extension - 1 x daily - 3 x weekly - 1 sets - 10 reps Sidelying Reverse Clamshell - 1 x daily - 3 x weekly - 1 sets - 10 reps Sidelying Hip Adduction Isometric with Ball - 1 x daily - 7 x weekly - 2 sets - 10 reps - 5 sec hold Earlie Counts, PT [facility] [address], [place], [place] [address] W: [phone] [email or web address]

## 2020-11-10 NOTE — Therapy (Signed)
[place] at [facility] [address] [place], [place], [address] Phone: [phone]   Fax:  [phone]  Physical Therapy Treatment  Patient Details  Name: Kristine Callahan MRN: [id number] Date of Birth: 1965/03/27 Referring Provider (PT): Dr. Sherlene Shams   Encounter Date: 11/10/2020   PT End of Session - 11/10/20 1254     Visit Number 6    Date for PT Re-Evaluation 01/15/21    Authorization Type BCBS    PT Start Time 1130    PT Stop Time 1215    PT Time Calculation (min) 45 min    Activity Tolerance Patient tolerated treatment well;No increased pain    Behavior During Therapy [facility] for tasks assessed/performed             Past Medical History:  Diagnosis Date   Headache    hx migraines none in years   Heart murmur, systolic    Negative echocardiogram 2016   Hypertension    Obesity    Prolapse of 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    Umbilical hernia    Uncontrolled type 2 diabetes mellitus with hyperglycemia, with long-term current use of insulin ([place]) 11/29/2019    Past Surgical History:  Procedure Laterality Date   CHOLECYSTECTOMY     COLONOSCOPY WITH PROPOFOL N/A 05/31/2016   Procedure: COLONOSCOPY WITH PROPOFOL;  Surgeon: Garlan Fair, MD;  Location: [facility];  Service: Endoscopy;  Laterality: N/A;    There were no vitals filed for this visit.   Subjective Assessment - 11/10/20 1137     Subjective The had difficulty wiht doing the colonoscopy but able to get biopsy. Patient has not had a real bowel movement since the colonoscopy. I feel toxic today with a headache. I have had mucous leakage due to not going to the bathroom for several days.    Patient Stated Goals No change with the pressure.    Currently in Pain? No/denies                               [facility] Adult PT Treatment/Exercise - 11/10/20 0001       Exercises   Exercises Other Exercises    Other Exercises  reviewed her  exercise sheet and went over the ones to continue.      Lumbar Exercises: Supine   Ab Set 20 reps;5 seconds    AB Set Limitations with tactlie cues to not bulge the midline of the abdomen and to contract the lower abdomen      Manual Therapy   Manual Therapy Myofascial release;Soft tissue mobilization    Soft tissue mobilization Circular massage to facilitate peristalic motion of the intestines; manual work to the abdomen; colon stimulation technque with starting at the left lower abdomen; I love you peristalic mation    Myofascial Release triangle manuever on the abdomen to proomte motility of intestines; release of the sac  of douglas                    PT Education - 11/10/20 1253     Education Details Access Code: [email or web address]    Person(s) Educated Patient    Methods Explanation;Demonstration;Handout    Comprehension Returned demonstration;Verbalized understanding              PT Short Term Goals - 10/27/20 1318       PT SHORT TERM GOAL #1   Title independent with diaphgramatic breathing, bracing  of the abdomen with minimal doming, and  low level pelvic floor contraction    Time 4    Period Weeks    Status Achieved      PT SHORT TERM GOAL #2   Title education on how to manage prolapse and vulvar care    Time 4    Period Weeks    Status Achieved      PT SHORT TERM GOAL #3   Title educated on correct toileting techniques to reduce strain on abdomen and pelvic floor    Time 4    Period Weeks    Status Achieved      PT SHORT TERM GOAL #4   Title education on vaginal moisturizers and lubricants for vaginal health    Time 4    Period Weeks    Status Achieved               PT Long Term Goals - 11/10/20 1258       PT LONG TERM GOAL #1   Title independent with advanced HEP for core strength and pelvic floor strength    Time 4    Period Months    Status On-going      PT LONG TERM GOAL #2   Title straining to have a bowel movement decreased >/= 50%  due to improve strength and coordination of the pelvic floor    Baseline the stool is loose and liquidly; has not had a bowel movement since 6 days    Time 4    Period Months    Status On-going      PT LONG TERM GOAL #3   Title fecal leakage decreased >/= 60% due to increased pelvic floro tone and strength >/= 3/5 holding for 10 seconds    Baseline 70% better    Time 4    Period Months    Status On-going      PT LONG TERM GOAL #4   Title able to reduce gas leakage >/= 60% due to improve coordination and control of the pelvic floor muscles    Baseline when haing gas some clear and brown liquid comes out    Time 4    Period Months    Status On-going                   Plan - 11/10/20 1254     Clinical Impression Statement Patient has not had a bowel movement since last thursday and she is getting a headache and feels toxic. Today the therapist performed abdominal work to help fascilitate a bowel movement. Patient is having moucous leaking from the rectum and no stool. Patient had her colonoscopy and waiting for the results. Patient is able to engage her abdominals but still needs tactile cues to not bulge her abdomen. Patient is consistent with her HEP. After manual work her abdomen was softer. Patient appears to have increased abdominal tone. Today we did not tape her abdominals due to increased tone. Patient will be seeing Dr. Wannetta Sender this week. She will progress her therapy after she hears from Dr. Wannetta Sender and Dr. Carlean Purl.    Personal Factors and Comorbidities Age;Sex;Comorbidity 3+;Fitness;Past/Current Experience;Time since onset of injury/illness/exacerbation    Comorbidities Hysterectomy; Diabetes; umbilical hernia    Examination-Activity Limitations Bed Mobility;Carry;Continence;Lift;Toileting    Examination-Participation Restrictions Cleaning;Community Activity;Interpersonal Relationship;Laundry    Stability/Clinical Decision Making Evolving/Moderate complexity     Rehab Potential Good    PT Frequency 1x / week    PT Duration Other (  comment)   4 months   PT Treatment/Interventions ADLs/Self Care Home Management;Electrical Stimulation;Cryotherapy;Moist Heat;Therapeutic activities;Therapeutic exercise;Neuromuscular re-education;Patient/family education;Manual techniques;Taping;Spinal Manipulations    PT Next Visit Plan See what the doctors say, continue to work on abdominal strength and pelvic floor strength    PT Home Exercise Plan Access Code: [email or web address]    Consulted and Agree with Plan of Care Patient             Patient will benefit from skilled therapeutic intervention in order to improve the following deficits and impairments:  Decreased endurance, Impaired tone, Decreased activity tolerance, Decreased strength, Increased fascial restricitons, Pain, Increased muscle spasms, Decreased coordination  Visit Diagnosis: Muscle weakness (generalized)  Other lack of coordination     Problem List Patient Active Problem List   Diagnosis Date Noted   Umbilical hernia without obstruction and without gangrene 10/09/2020   Diastasis recti 10/09/2020   Perianal dermatitis 10/09/2020   Type 2 diabetes mellitus with diabetic polyneuropathy, without long-term current use of insulin ([place]) 11/29/2019   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

## 2020-11-13 ENCOUNTER — Ambulatory Visit (INDEPENDENT_AMBULATORY_CARE_PROVIDER_SITE_OTHER): Payer: BC Managed Care – PPO | Admitting: Obstetrics and Gynecology

## 2020-11-13 ENCOUNTER — Encounter: Payer: Self-pay | Admitting: Obstetrics and Gynecology

## 2020-11-13 ENCOUNTER — Other Ambulatory Visit: Payer: Self-pay

## 2020-11-13 VITALS — BP 142/73 | HR 91 | Ht 70.0 in | Wt 230.0 lb

## 2020-11-13 DIAGNOSIS — N816 Rectocele: Secondary | ICD-10-CM

## 2020-11-13 DIAGNOSIS — N393 Stress incontinence (female) (male): Secondary | ICD-10-CM

## 2020-11-13 NOTE — Progress Notes (Signed)
[facility] Return Visit  SUBJECTIVE  History of Present Illness: Kristine Callahan is a 55 y.o. female seen in follow-up with prolapse and urinary and fecal incontinence, presenting to discuss surgery options after urodynamic testing.    She is seeing an endocrinologist in a few weeks. She has not had a recent A1c. She also enquired about the results of her colonoscopy. We reviewed that the biopsy showed inflammatory bowel disease, but that Dr Carlean Purl would need to provide more details about these results and treatment options.   Urodynamic Impression:  1. Sensation was normal; capacity was normal 2. Stress Incontinence was not demonstrated; 3. Detrusor Overactivity was not demonstrated. 4. Emptying was normal with a normal PVR, a sustained detrusor contraction present,  abdominal straining not present, normal urethral sphincter activity on EMG.  Past Medical History: Patient  has a past medical history of Headache, Heart murmur, systolic, Hypertension, Obesity, Prolapse of 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, Umbilical hernia, and Uncontrolled type 2 diabetes mellitus with hyperglycemia, with long-term current use of insulin ([place]) (11/29/2019).   Past Surgical History: She  has a past surgical history that includes Cholecystectomy and Colonoscopy with propofol (N/A, 05/31/2016).   Medications: She has a current medication list which includes the following prescription(s): aspirin, accu-chek guide me, dapagliflozin propanediol, fenofibrate, accu-chek guide, hydrocortisone, victoza, lisinopril-hydrochlorothiazide, metformin, gerhardt's butt cream, nystatin-triamcinolone ointment, rosuvastatin, and tresiba flextouch.   Allergies: Patient is allergic to latex.   Social History: Patient  reports that she has never smoked. She has never used smokeless tobacco. She reports current alcohol use. She reports that she does not use drugs.      OBJECTIVE     Physical Exam: Vitals:   11/13/20 1135   BP: (!) 142/73  Pulse: 91  Weight: 230 lb (104.3 kg)  Height: '5\' 10"'$  (1.778 m)   Gen: No apparent distress, A&O x 3.  Detailed Urogynecologic Evaluation:  Deferred. Prior exam showed: POP-Q (07/14/20):    POP-Q   -2                                            Aa   -2                                           Ba   -5.5                                              C    4.5                                            Gh   4                                            Pb   10  tvl    0                                            Ap   0                                            Bp   -8                                              D        ASSESSMENT AND PLAN    [name] is a 55 y.o. with:  1. Prolapse of posterior vaginal wall   2. SUI (stress urinary incontinence, female)     Plan for surgery: Exam under anesthesia, posterior repair and perineorrhaphy, possible sacrospinous ligament fixation  - We reviewed the patient's specific anatomic and functional findings, with the assistance of diagrams, and together finalized the above procedure. The planned surgical procedures were discussed along with the surgical risks outlined below, which were also provided on a detailed handout. Additional treatment options including expectant management, conservative management, medical management were discussed where appropriate.  We reviewed the benefits and risks of each treatment option.   General Surgical Risks: For all procedures, there are risks of bleeding, infection, damage to surrounding organs including but not limited to bowel, bladder, blood vessels, ureters and nerves, and need for further surgery if an injury were to occur. These risks are all low with minimally invasive surgery.   There are risks of numbness and weakness at any body site or buttock/rectal pain.  It is possible that baseline pain can be worsened by surgery, either  with or without mesh. If surgery is vaginal, there is also a low risk of possible conversion to laparoscopy or open abdominal incision where indicated. Very rare risks include blood transfusion, blood clot, heart attack, pneumonia, or death.   There is also a risk of short-term postoperative urinary retention with need to use a catheter. About half of patients need to go home from surgery with a catheter, which is then later removed in the office. The risk of long-term need for a catheter is very low. There is also a risk of worsening of overactive bladder.   Prolapse (with or without mesh): Risk factors for surgical failure  include things that put pressure on your pelvis and the surgical repair, including obesity, chronic cough, and heavy lifting or straining (including lifting children or adults, straining on the toilet, or lifting heavy objects such as furniture or anything weighing >25 lbs. Risks of recurrence is 20-30% with vaginal native tissue repair and a less than 10% with sacrocolpopexy with mesh.     - For preop Visit:  She is required to have a visit within 30 days of her surgery.    - Medical clearance: required. Letter sent to Dr Fara Olden requesting risk stratification and medical optimization. Will also ensure she has a recent Hgb A1c.  - Anticoagulant use: No - Medicaid Hysterectomy form: No - Accepts blood transfusion: Yes - Expected length of stay: outpatient  Request sent for surgery scheduling.   [name], MD  Time spent: I spent 35 minutes dedicated to the care of this patient on the date of this encounter to include pre-visit review of records, face-to-face time with the patient and post visit documentation.

## 2020-11-15 ENCOUNTER — Other Ambulatory Visit: Payer: Self-pay | Admitting: Internal Medicine

## 2020-11-17 ENCOUNTER — Encounter: Payer: BC Managed Care – PPO | Admitting: Physical Therapy

## 2020-11-17 ENCOUNTER — Encounter: Payer: Self-pay | Admitting: Physical Therapy

## 2020-11-17 ENCOUNTER — Encounter: Payer: Self-pay | Admitting: Internal Medicine

## 2020-11-17 ENCOUNTER — Other Ambulatory Visit: Payer: Self-pay

## 2020-11-17 ENCOUNTER — Other Ambulatory Visit: Payer: Self-pay | Admitting: Internal Medicine

## 2020-11-17 DIAGNOSIS — M6281 Muscle weakness (generalized): Secondary | ICD-10-CM | POA: Diagnosis not present

## 2020-11-17 DIAGNOSIS — R278 Other lack of coordination: Secondary | ICD-10-CM

## 2020-11-17 DIAGNOSIS — K5120 Ulcerative (chronic) proctitis without complications: Secondary | ICD-10-CM

## 2020-11-17 HISTORY — DX: Ulcerative (chronic) proctitis without complications: K51.20

## 2020-11-17 MED ORDER — MESALAMINE 1000 MG RE SUPP: 1000.0000 mg | Freq: Every day | RECTAL | 12 refills | Status: AC

## 2020-11-17 NOTE — Patient Instructions (Signed)
Access Code: [email or web address] URL: [email or web address] Date: 11/17/2020 Prepared by: Earlie Counts  Program Notes lay on side, breath out as you slide your hand down your leg 15x    Exercises Hooklying Transversus Abdominis Palpation - 3 x daily - 7 x weekly - 1 sets - 10 reps Cat Cow - 1 x daily - 7 x weekly - 1 sets - 15 reps Sidelying Open Book Thoracic Lumbar Rotation and Extension - 1 x daily - 3 x weekly - 1 sets - 10 reps Sidelying Hip Adduction Isometric with Ball - 1 x daily - 4 x weekly - 2 sets - 10 reps - 5 sec hold Beginner Bridge - 1 x daily - 4 x weekly - 1 sets - 10 reps Supine ASLR with Ab Bracing and Shoulder Extension with Anchored Resistance - 1 x daily - 4 x weekly - 1 sets - 10 reps Standing Anti-Rotation Press with Anchored Resistance - 1 x daily - 4 x weekly - 3 sets - 10 reps Standing Anti-Rotation Press with Anchored Resistance - 1 x daily - 4 x weekly - 1 sets - 10 reps Earlie Counts, PT [facility] [address] [place], [place] [address] W: [phone] [email or web address]

## 2020-11-17 NOTE — Therapy (Signed)
[place] at [facility] [address], [place], [place], [address] Phone: [phone]   Fax:  [phone]  Physical Therapy Treatment  Patient Details  Name: Kristine Callahan MRN: [id number] Date of Birth: 1965/05/14 Referring Provider (PT): Dr. Sherlene Shams   Encounter Date: 11/17/2020   PT End of Session - 11/17/20 1554     Visit Number 7    Date for PT Re-Evaluation 01/15/21    Authorization Type BCBS    PT Start Time 1500    PT Stop Time 1545    PT Time Calculation (min) 45 min    Activity Tolerance Patient tolerated treatment well;No increased pain    Behavior During Therapy [facility] for tasks assessed/performed             Past Medical History:  Diagnosis Date   Headache    hx migraines none in years   Heart murmur, systolic    Negative echocardiogram 2016   Hypertension    Obesity    Prolapse of 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    Umbilical hernia    Uncontrolled type 2 diabetes mellitus with hyperglycemia, with long-term current use of insulin ([place]) 11/29/2019    Past Surgical History:  Procedure Laterality Date   CHOLECYSTECTOMY     COLONOSCOPY WITH PROPOFOL N/A 05/31/2016   Procedure: COLONOSCOPY WITH PROPOFOL;  Surgeon: Garlan Fair, MD;  Location: [facility];  Service: Endoscopy;  Laterality: N/A;    There were no vitals filed for this visit.   Subjective Assessment - 11/17/20 1507     Subjective I am waiting for a date for the surgery. I will have the posterior wall repair. I have had normal bowel movements this week. I am not straining. Having more type 4 stool. The pressure in the rectal area is 80% better and have it occasionally intead of daily.    Patient Stated Goals No change with the pressure.    Currently in Pain? No/denies                               [facility] Adult PT Treatment/Exercise - 11/17/20 0001       Self-Care   Self-Care Other Self-Care Comments    Other Self-Care  Comments  showed patient a video on weak posterior pelvic wall and how therapy can prepare her for surgery      Lumbar Exercises: Standing   Other Standing Lumbar Exercises standing Pallof 15 times each way with red band and pelvic floor contraction      Lumbar Exercises: Supine   Bridge 15 reps   with green band around left inner knee and holding the band in the right hand with opposite tensrion 15x each way with VC on correct breath   Bridge Limitations with pelvic floor contraction    Other Supine Lumbar Exercises supine with one band on the inner knee and bilateral shoulder extension 20x each way with correct breath, with pelvic floor contraction                    PT Education - 11/17/20 1549     Education Details Access Code: [email or web address]    Person(s) Educated Patient    Methods Explanation;Demonstration;Verbal cues;Handout    Comprehension Returned demonstration;Verbalized understanding              PT Short Term Goals - 10/27/20 1318       PT SHORT TERM GOAL #1  Title independent with diaphgramatic breathing, bracing of the abdomen with minimal doming, and  low level pelvic floor contraction    Time 4    Period Weeks    Status Achieved      PT SHORT TERM GOAL #2   Title education on how to manage prolapse and vulvar care    Time 4    Period Weeks    Status Achieved      PT SHORT TERM GOAL #3   Title educated on correct toileting techniques to reduce strain on abdomen and pelvic floor    Time 4    Period Weeks    Status Achieved      PT SHORT TERM GOAL #4   Title education on vaginal moisturizers and lubricants for vaginal health    Time 4    Period Weeks    Status Achieved               PT Long Term Goals - 11/17/20 1513       PT LONG TERM GOAL #1   Title independent with advanced HEP for core strength and pelvic floor strength    Time 4    Period Months    Status On-going      PT LONG TERM GOAL #2   Title straining to have a bowel  movement decreased >/= 50% due to improve strength and coordination of the pelvic floor    Time 4    Period Months    Status Achieved      PT LONG TERM GOAL #3   Title fecal leakage decreased >/= 60% due to increased pelvic floro tone and strength >/= 3/5 holding for 10 seconds    Baseline 70% better; today had a very small aomount of fecal lakage with pressure. She still gets the clear liquid discharge and that has reduced    Time 4    Period Months    Status Achieved      PT LONG TERM GOAL #4   Title able to reduce gas leakage >/= 60% due to improve coordination and control of the pelvic floor muscles    Baseline still very gassy. Gets trapped gas on the right side    Time 4    Period Months    Status On-going                   Plan - 11/17/20 1555     Clinical Impression Statement Patient is waiting for MD office to call her for scheduling surgery to repair the posterior vaginal wall. Patient reports her rectal pressure is 80% better. When she has an increase in rectal pressure there is more clear liquid coming out of the rectum. Patient continues to have gas leakage that she is not able to control. Patient reports stool leakage is 70% better. Patient is not having urinary leakage. Patient is having more type 4  stool and full bowel movement. She is learning how to strengthen her pelvic floor and abdominals with some resistance. Patient will benefit from skilled therapy to improve pelvic floor strength to reduce leakage of the rectum and gas leakage.    Personal Factors and Comorbidities Age;Sex;Comorbidity 3+;Fitness;Past/Current Experience;Time since onset of injury/illness/exacerbation    Comorbidities Hysterectomy; Diabetes; umbilical hernia    Examination-Activity Limitations Bed Mobility;Carry;Continence;Lift;Toileting    Examination-Participation Restrictions Cleaning;Community Activity;Interpersonal Relationship;Laundry    Stability/Clinical Decision Making  Evolving/Moderate complexity    Rehab Potential Good    PT Frequency 1x / week  PT Duration Other (comment)   4 months   PT Treatment/Interventions ADLs/Self Care Home Management;Electrical Stimulation;Cryotherapy;Moist Heat;Therapeutic activities;Therapeutic exercise;Neuromuscular re-education;Patient/family education;Manual techniques;Taping;Spinal Manipulations    PT Next Visit Plan continue to work on abdominal strength and pelvic floor strength    PT Home Exercise Plan Access Code: [email or web address]    Consulted and Agree with Plan of Care Patient             Patient will benefit from skilled therapeutic intervention in order to improve the following deficits and impairments:  Decreased endurance, Impaired tone, Decreased activity tolerance, Decreased strength, Increased fascial restricitons, Pain, Increased muscle spasms, Decreased coordination  Visit Diagnosis: Muscle weakness (generalized)  Other lack of coordination     Problem List Patient Active Problem List   Diagnosis Date Noted   Umbilical hernia without obstruction and without gangrene 10/09/2020   Diastasis recti 10/09/2020   Perianal dermatitis 10/09/2020   Type 2 diabetes mellitus with diabetic polyneuropathy, without long-term current use of insulin ([place]) 11/29/2019   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 [address], [place], [place], [address] Phone: [phone]   Fax:  [phone]  Name: Kristine Callahan MRN: [id number] Date of Birth: 21-Apr-1965

## 2020-11-24 ENCOUNTER — Other Ambulatory Visit: Payer: Self-pay

## 2020-11-24 ENCOUNTER — Encounter: Payer: BC Managed Care – PPO | Attending: Obstetrics and Gynecology | Admitting: Physical Therapy

## 2020-11-24 ENCOUNTER — Encounter: Payer: Self-pay | Admitting: Physical Therapy

## 2020-11-24 DIAGNOSIS — M6281 Muscle weakness (generalized): Secondary | ICD-10-CM

## 2020-11-24 DIAGNOSIS — R278 Other lack of coordination: Secondary | ICD-10-CM | POA: Diagnosis present

## 2020-11-24 NOTE — Therapy (Signed)
[place] at [facility] [address] [place], [place], [address] Phone: [phone]   Fax:  [phone]  Physical Therapy Treatment  Patient Details  Name: Kristine Callahan MRN: [id number] Date of Birth: September 16, 1965 Referring Provider (PT): Dr. 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   Subjective Assessment - 11/24/20 1127     Subjective MD said I have collitis in the rectum and is localized. I am using a suppositiory fo rthe inflammation. I will have my repair on 01/21/2021. The hemorroids have decreased in size. No fecal lekage in the past few days. I have the mucous leakage. Rectum pain is very brief.    Patient Stated Goals No change with the pressure.    Currently in Pain? Yes    Pain Score 2     Pain Location Rectum    Pain Orientation Mid    Pain Descriptors / Indicators Aching    Pain Type  Chronic pain    Pain Onset More than a month ago    Pain Frequency Intermittent    Aggravating Factors  not  sure    Pain Relieving Factors just goes away    Multiple Pain Sites No                               [facility] Adult PT Treatment/Exercise - 11/24/20 0001       Lumbar Exercises: Standing   Other Standing Lumbar Exercises standing Pallof 15 times each way with red band and pelvic floor contraction    Other Standing Lumbar Exercises chair squat 15 times wiht pelvic floor contraction; plank on wall holding 15 sec 2 times then side plank holding for 15 seconds each side.      Lumbar Exercises: Supine   Bridge 15 reps   with green band around left inner knee and holding the band in the right hand with opposite tensrion 15x each way with VC on correct breath   Bridge Limitations with pelvic floor contraction    Other Supine Lumbar Exercises supine with one band on the inner knee and bilateral shoulder extension 20x each way with correct breath, with pelvic floor contraction  PT Education - 11/24/20 1208     Education Details Access Code: [email or web address]    Person(s) Educated Patient    Methods Explanation;Demonstration;Verbal cues;Handout    Comprehension Returned demonstration;Verbalized understanding              PT Short Term Goals - 10/27/20 1318       PT SHORT TERM GOAL #1   Title independent with diaphgramatic breathing, bracing of the abdomen with minimal doming, and  low level pelvic floor contraction    Time 4    Period Weeks    Status Achieved      PT SHORT TERM GOAL #2   Title education on how to manage prolapse and vulvar care    Time 4    Period Weeks    Status Achieved      PT SHORT TERM GOAL #3   Title educated on correct toileting techniques to reduce strain on abdomen and pelvic floor    Time 4    Period Weeks    Status Achieved      PT SHORT TERM GOAL #4   Title education on vaginal moisturizers and  lubricants for vaginal health    Time 4    Period Weeks    Status Achieved               PT Long Term Goals - 11/24/20 1212       PT LONG TERM GOAL #1   Title independent with advanced HEP for core strength and pelvic floor strength    Time 4    Period Months    Status On-going      PT LONG TERM GOAL #2   Title straining to have a bowel movement decreased >/= 50% due to improve strength and coordination of the pelvic floor    Time 4    Period Months    Status Achieved      PT LONG TERM GOAL #3   Title fecal leakage decreased >/= 60% due to increased pelvic floro tone and strength >/= 3/5 holding for 10 seconds    Period Months    Status Achieved      PT LONG TERM GOAL #4   Title able to reduce gas leakage >/= 60% due to improve coordination and control of the pelvic floor muscles    Time 4    Period Months    Status On-going                   Plan - 11/24/20 1208     Clinical Impression Statement Patient will have surgery on 11/3 to repair the posterior wall. She is using a suppository in the rectum due to having collitis only in the rectum. Her rectal pain is less and does not last as long. Patient is able to contract her abdomen better and reduce her bulge. She is able to do her HEP corretly but just needs help with her breath work. She reports her hemorroids are smaller. Patient has one more visit to go over her exercise program for hip and core strength with pelvic floor.    Personal Factors and Comorbidities Age;Sex;Comorbidity 3+;Fitness;Past/Current Experience;Time since onset of injury/illness/exacerbation    Comorbidities Hysterectomy; Diabetes; umbilical hernia    Examination-Activity Limitations Bed Mobility;Carry;Continence;Lift;Toileting    Examination-Participation Restrictions Cleaning;Community Activity;Interpersonal Relationship;Laundry    Stability/Clinical Decision Making Evolving/Moderate complexity    Rehab Potential Good    PT Frequency  1x / week    PT Duration Other (comment)  4 months   PT Treatment/Interventions ADLs/Self Care Home Management;Electrical Stimulation;Cryotherapy;Moist Heat;Therapeutic activities;Therapeutic exercise;Neuromuscular re-education;Patient/family education;Manual techniques;Taping;Spinal Manipulations    PT Next Visit Plan continue to work on abdominal strength and pelvic floor strength; wall bird dog with pushing into a ball; dead bug with pushing into a ball    PT Home Exercise Plan Access Code: [email or web address]    Consulted and Agree with Plan of Care Patient             Patient will benefit from skilled therapeutic intervention in order to improve the following deficits and impairments:  Decreased endurance, Impaired tone, Decreased activity tolerance, Decreased strength, Increased fascial restricitons, Pain, Increased muscle spasms, Decreased coordination  Visit Diagnosis: Muscle weakness (generalized)  Other lack of coordination     Problem List Patient Active Problem List   Diagnosis Date Noted   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[place]) [id number]   Umbilical hernia without obstruction and without gangrene 10/09/2020   Diastasis recti 10/09/2020   Perianal dermatitis 10/09/2020   Type 2 diabetes mellitus with diabetic polyneuropathy, without long-term current use of insulin ([place]) 11/29/2019   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

## 2020-11-24 NOTE — Patient Instructions (Signed)
Access Code: [email or web address] URL: [email or web address] Date: 11/24/2020 Prepared by: Earlie Counts  Program Notes lay on side, breath out as you slide your hand down your leg 15x    Exercises Hooklying Transversus Abdominis Palpation - 3 x daily - 7 x weekly - 1 sets - 10 reps Cat Cow - 1 x daily - 7 x weekly - 1 sets - 15 reps Sidelying Open Book Thoracic Lumbar Rotation and Extension - 1 x daily - 3 x weekly - 1 sets - 10 reps Sidelying Hip Adduction Isometric with Ball - 1 x daily - 4 x weekly - 2 sets - 10 reps - 5 sec hold Beginner Bridge - 1 x daily - 4 x weekly - 1 sets - 10 reps Supine ASLR with Ab Bracing and Shoulder Extension with Anchored Resistance - 1 x daily - 4 x weekly - 1 sets - 10 reps Standing Anti-Rotation Press with Anchored Resistance - 1 x daily - 4 x weekly - 3 sets - 10 reps Squat with Chair Touch - 1 x daily - 4 x weekly - 1 sets - 10 reps Standing Plank on Wall - 1 x daily - 4 x weekly - 1 sets - 4 reps - 15 sec hold Standing Full Side Plank on Wall - 1 x daily - 4 x weekly - 1 sets - 4 reps - 15 sec hold  Earlie Counts, PT [facility] [address] [place], [place] [address] W: [phone] [email or web address]

## 2020-11-25 ENCOUNTER — Ambulatory Visit (INDEPENDENT_AMBULATORY_CARE_PROVIDER_SITE_OTHER): Payer: BC Managed Care – PPO | Admitting: Internal Medicine

## 2020-11-25 ENCOUNTER — Other Ambulatory Visit: Payer: Self-pay

## 2020-11-25 ENCOUNTER — Encounter: Payer: Self-pay | Admitting: Internal Medicine

## 2020-11-25 VITALS — BP 124/76 | HR 68 | Ht 70.0 in | Wt 228.0 lb

## 2020-11-25 DIAGNOSIS — Z794 Long term (current) use of insulin: Secondary | ICD-10-CM

## 2020-11-25 DIAGNOSIS — E1142 Type 2 diabetes mellitus with diabetic polyneuropathy: Secondary | ICD-10-CM | POA: Diagnosis not present

## 2020-11-25 DIAGNOSIS — E113593 Type 2 diabetes mellitus with proliferative diabetic retinopathy without macular edema, bilateral: Secondary | ICD-10-CM

## 2020-11-25 DIAGNOSIS — E1165 Type 2 diabetes mellitus with hyperglycemia: Secondary | ICD-10-CM | POA: Diagnosis not present

## 2020-11-25 LAB — POCT GLYCOSYLATED HEMOGLOBIN (HGB A1C): Hemoglobin A1C: 8.3 % — AB (ref 4.0–5.6)

## 2020-11-25 LAB — GLUCOSE, POCT (MANUAL RESULT ENTRY): POC Glucose: 154 mg/dl — AB (ref 70–99)

## 2020-11-25 MED ORDER — NOVOFINE PLUS PEN NEEDLE 32G X 4 MM MISC
1.0000 | Freq: Four times a day (QID) | 3 refills | Status: DC
Start: 2020-11-25 — End: 2020-11-27

## 2020-11-25 MED ORDER — DEXCOM G6 SENSOR MISC: 1.0000 | 3 refills | Status: DC

## 2020-11-25 MED ORDER — METFORMIN HCL 1000 MG PO TABS: 1000.0000 mg | ORAL_TABLET | Freq: Two times a day (BID) | ORAL | 3 refills | Status: DC

## 2020-11-25 MED ORDER — TRESIBA FLEXTOUCH 100 UNIT/ML ~~LOC~~ SOPN: 30.0000 [IU] | PEN_INJECTOR | Freq: Every day | SUBCUTANEOUS | 3 refills | Status: DC

## 2020-11-25 MED ORDER — NOVOLOG FLEXPEN 100 UNIT/ML ~~LOC~~ SOPN: PEN_INJECTOR | SUBCUTANEOUS | 1 refills | Status: DC

## 2020-11-25 MED ORDER — VICTOZA 18 MG/3ML ~~LOC~~ SOPN: 1.8000 mg | PEN_INJECTOR | Freq: Every day | SUBCUTANEOUS | 3 refills | Status: DC

## 2020-11-25 MED ORDER — DAPAGLIFLOZIN PROPANEDIOL 10 MG PO TABS: 10.0000 mg | ORAL_TABLET | Freq: Every day | ORAL | 3 refills | Status: DC

## 2020-11-25 MED ORDER — DEXCOM G6 TRANSMITTER MISC: 1.0000 | 3 refills | Status: DC

## 2020-11-25 NOTE — Patient Instructions (Signed)
-   Continue Metformin 1000 mg Twice daily  - Continue Farxiga 10 mg daily  - Continue Victoza 1.8 mg daily  - Increase Tresiba to 30 units daily  - Start Novolog 8 units with each meal  -Novolog correctional insulin: ADD extra units on insulin to your meal-time Novolog dose if your blood sugars are higher than 155. Use the scale below to help guide you:   Blood sugar before meal Number of units to inject  Less than 155 0 unit  156 -  180 1 units  181 -  205 2 units  206 -  230 3 units  231 -  255 4 units  256 -  280 5 units  281 -  305 6 units  306 -  330 7 units  331 -  355 8 units      HOW TO TREAT LOW BLOOD SUGARS (Blood sugar LESS THAN 70 MG/DL) Please follow the RULE OF 15 for the treatment of hypoglycemia treatment (when your (blood sugars are less than 70 mg/dL)   STEP 1: Take 15 grams of carbohydrates when your blood sugar is low, which includes:  3-4 GLUCOSE TABS  OR 3-4 OZ OF JUICE OR REGULAR SODA OR ONE TUBE OF GLUCOSE GEL    STEP 2: RECHECK blood sugar in 15 MINUTES STEP 3: If your blood sugar is still low at the 15 minute recheck --> then, go back to STEP 1 and treat AGAIN with another 15 grams of carbohydrates.

## 2020-11-25 NOTE — Progress Notes (Signed)
Name: Kristine Callahan  Age/ Sex: 55 y.o., female   MRN/ DOB: [id number], 1965-04-06     PCP: Leeroy Cha, MD   Reason for Endocrinology Evaluation: Type 2 Diabetes Mellitus  Initial Endocrine Consultative Visit: T2DM and HTN    PATIENT IDENTIFIER: [name] is a 55 y.o. female with a past medical history of HTN, T2 DM and ulcerative proctitis. The patient has followed with Endocrinology clinic since 11/29/2019 for consultative assistance with management of her diabetes.  DIABETIC HISTORY:  [name] was diagnosed with DM yrs ago, she has been on Glipizide, insulin started 08/2019. Her hemoglobin A1c has ranged from 7.1% , peaking at 11.0%  in 2016.  On her initial visit to our clinic she had an A1c of 7.9% .  She was on Janumet, Farxiga, Victoza and basal insulin. We stopped Janumet , started Metformin, switched Levemir tresiba and continued [place] and Victoza.   She was lost to follow-up until her return and 11/2020 with an A1c of 8.3%  SUBJECTIVE:   During the last visit (11/29/2019): A1c 7.9 % we stopped Janumet and continued Metformin  Today (11/25/2020): Ms. Stucky is here for a follow up on diabetes management.She has NOT been to our clinic in 12 months.    She has been diagnosed with ulcerative proctitis  She is scheduled for a rectocele repair 01/2021, she wants to optimize her A1c prior to sx.  She has fecal leakage as well as mucous leakage   She checks her blood sugars occasionally  times daily. The patient has not had hypoglycemic episodes since the last clinic visit.   She eats 3 meals a day, snacks twice a day.    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/2020   Headache    hx migraines none in years   Heart murmur, systolic    Negative echocardiogram 2016   Hypertension    Obesity    Prolapse of 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    Umbilical hernia    Uncontrolled type 2 diabetes mellitus with hyperglycemia, with long-term current use of insulin ([place]) 11/29/2019   Past Surgical History:  Past Surgical History:  Procedure Laterality Date   CHOLECYSTECTOMY     COLONOSCOPY WITH PROPOFOL N/A 05/31/2016   Procedure: COLONOSCOPY WITH PROPOFOL;  Surgeon: Garlan Fair, MD;  Location: [facility];  Service: Endoscopy;  Laterality: N/A;   Social History:  reports that she has never smoked. She has never used smokeless tobacco. She reports current alcohol use. She reports that she does not use drugs. Family History:  Family History  Adopted: Yes     HOME MEDICATIONS: Allergies as of 11/25/2020       Reactions   Latex Other (See Comments)   Taste sensation         Medication List        Accurate as of November 25, 2020 11:35 AM. If you have any questions, ask your nurse or doctor.          Accu-Chek Guide Me w/Device  Kit by Does not apply route.   Accu-Chek Guide test strip Generic drug: glucose blood 1 each by Other route as needed for other. Use as instructed   aspirin 81 MG tablet Take 81 mg by mouth daily.   dapagliflozin propanediol 10 MG Tabs tablet Commonly known as: FARXIGA Take 1 tablet (10 mg total) by mouth daily.   Dexcom G6 Sensor Misc 1 Device by Does not apply route as directed. Started by: Dorita Sciara, MD   Dexcom G6 Transmitter Misc 1 Device by Does not apply route as directed. Started by: Dorita Sciara, MD   fenofibrate 160 MG tablet Take 160 mg by mouth daily.   Gerhardt's butt cream Crea Apply 2-4  x a day to perianal rash   hydrocortisone 2.5 % rectal cream Commonly known as: ANUSOL-HC Place 1 application rectally 2 (two) times daily.   lisinopril-hydrochlorothiazide 20-25 MG tablet Commonly known as: ZESTORETIC Take 1 tablet by mouth daily.   mesalamine 1000 MG suppository Commonly known as: CANASA Place 1 suppository (1,000 mg total) rectally at bedtime.   metFORMIN 1000 MG tablet Commonly known as: GLUCOPHAGE Take 1 tablet (1,000 mg total) by mouth 2 (two) times daily with a meal.   NovoFine Plus Pen Needle 32G X 4 MM Misc Generic drug: Insulin Pen Needle 1 Device by Does not apply route in the morning, at noon, in the evening, and at bedtime. Started by: Dorita Sciara, MD   NovoLOG FlexPen 100 UNIT/ML FlexPen Generic drug: insulin aspart Max daily 60 units Started by: Dorita Sciara, MD   nystatin-triamcinolone ointment Commonly known as: MYCOLOG Apply 1 application topically 2 (two) times daily.   rosuvastatin 5 MG tablet Commonly known as: CRESTOR Take 5 mg by mouth daily.   Tyler Aas FlexTouch 100 UNIT/ML FlexTouch Pen Generic drug: insulin degludec Inject 30 Units into the skin daily. What changed: how much to take Changed by: Dorita Sciara, MD   Victoza 18 MG/3ML Sopn Generic drug: liraglutide Inject 1.8 mg into the skin daily.         OBJECTIVE:   Vital Signs: BP 124/76 (BP Location: Left Arm, Patient Position: Sitting, Cuff Size: Small)   Pulse 68   Ht $R'5\' 10"'OW$  (1.778 m)   Wt 228 lb (103.4 kg)   LMP  (LMP Unknown)   SpO2 97%   BMI 32.71 kg/m   Wt Readings from Last 3 Encounters:  11/25/20 228 lb (103.4 kg)  11/13/20 230 lb (104.3 kg)  11/05/20 230 lb (104.3 kg)     Exam: General: Pt appears well and is in NAD  Lungs: Clear with good BS bilat with no rales, rhonchi, or wheezes  Heart: RRR with normal S1 and S2 and no gallops; no murmurs; no rub  Abdomen: Normoactive bowel sounds, soft, nontender, without masses  or organomegaly palpable  Extremities: No pretibial edema. No tremor. Normal strength and motion throughout. See detailed diabetic foot exam below.  Neuro: MS is good with appropriate affect, pt is alert and Ox3    DM foot exam: 11/25/2020  The skin of the feet is intact without sores or ulcerations. The pedal pulses are 1+ on right and 1+ on left. The sensation is intact to a screening 5.07, 10 gram monofilament bilaterally          DATA REVIEWED:  Lab Results  Component Value Date   HGBA1C 8.3 (A) 11/25/2020   HGBA1C 7.9 (A) 11/29/2019     ASSESSMENT / PLAN / RECOMMENDATIONS:  1) Type 2 Diabetes Mellitus, poorly controlled, With retinopathic and neuropathic  complications - Most recent A1c of 8.3 %. Goal A1c < 7.0 %.    -The patient is here to optimize her A1c in preparation for rectal surgery in less than 2 months, I have explained to the patient that I will try my best in optimizing her glucose control with such short time. -I have recommended starting prandial insulin which she agreed to -She was encouraged to check her glucose before each meal and use correction scale as needed -I have also prescribed the Dexcom -I will make the following changes -Patient tells me that she had a physical through her PCP sometime in June, no labs will be done today as I do not have any records of recent labs and to avoid duplication   MEDICATIONS: - Continue Metformin 1000 mg Twice daily  - Continue Farxiga 10 mg daily  - Continue Victoza 1.8 mg daily  - Increase Tresiba to 30 units daily  - Start Novolog 8 units with each meal  -CF: NovoLog (BG -130/25)  EDUCATION / INSTRUCTIONS: BG monitoring instructions: Patient is instructed to check her blood sugars 3 times a day, before meals. Call [facility] if: BG persistently < 70  I reviewed the Rule of 15 for the treatment of hypoglycemia in detail with the patient. Literature supplied.   2) Diabetic complications:   Eye: Does  have known diabetic retinopathy.  Neuro/ Feet: Does not have known diabetic peripheral neuropathy .  Renal: Patient does not have known baseline CKD. She   is on an ACEI/ARB at present.     3) Dyslipidemia: Patient is on Rosuvastatin 5 mg daily . LDL at  goal.We had discussed cardiovascular benefits of statins in the past.    F/U in week of 10/24   Signed electronically by: Mack Guise, MD  [facility]  [facility] [address] [place], [place] [address] Phone: [phone] FAX: [phone]   CC: Leeroy Cha, MD [address] [place] [address] Phone: [phone]  Fax: [phone]  Return to Endocrinology clinic as below: Future Appointments  Date Time Provider [place]  12/01/2020  1:00 PM Monico Hoar, [place] [facility]  01/20/2021 10:30 AM Gatha Mayer, MD [facility]

## 2020-11-26 ENCOUNTER — Telehealth: Payer: Self-pay | Admitting: Pharmacy Technician

## 2020-11-26 NOTE — Telephone Encounter (Signed)
Do you want to change this?

## 2020-11-26 NOTE — Telephone Encounter (Addendum)
Patient Advocate Encounter   Received notification from [facility] that prior authorization for NOVOFINE PLUS PEN NEEDLES is required.   PA submitted on 11/26/2020 Key [id number] Status is DENIED   Formulary alternative is: BD Ultra-Fine Pen Needles and Syringes.    [facility] will continue to follow   Ronney Asters, CPhT Patient Advocate [facility] Phone: [phone] Fax:  [phone]

## 2020-11-27 ENCOUNTER — Other Ambulatory Visit: Payer: Self-pay | Admitting: Internal Medicine

## 2020-11-27 MED ORDER — INSULIN PEN NEEDLE 32G X 4 MM MISC: 1.0000 | Freq: Four times a day (QID) | 2 refills | Status: DC

## 2020-11-27 NOTE — Telephone Encounter (Signed)
Patient has been notified

## 2020-11-30 ENCOUNTER — Other Ambulatory Visit: Payer: Self-pay | Admitting: Internal Medicine

## 2020-12-01 ENCOUNTER — Encounter: Payer: Self-pay | Admitting: Physical Therapy

## 2020-12-01 ENCOUNTER — Other Ambulatory Visit: Payer: Self-pay

## 2020-12-01 ENCOUNTER — Encounter: Payer: BC Managed Care – PPO | Admitting: Physical Therapy

## 2020-12-01 DIAGNOSIS — M6281 Muscle weakness (generalized): Secondary | ICD-10-CM

## 2020-12-01 DIAGNOSIS — R278 Other lack of coordination: Secondary | ICD-10-CM

## 2020-12-01 NOTE — Therapy (Signed)
[place] at [facility] [address] [place], [place], [address] Phone: [phone]   Fax:  [phone]  Physical Therapy Treatment  Patient Details  Name: Kristine Callahan MRN: [id number] Date of Birth: 04-Aug-1965 Referring Provider (PT): Dr. 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   Subjective Assessment - 12/01/20 1302     Subjective I was ding well until today. I have been using the suppositories the  doctor has given me. I leaked stool 6 times today. It is mostly mucous with fecal matter in it. It is more waxy. I am not able to hold it in at all. I use the suppositories for 3 months. Ptatinet has not had any rectal pain and normal stools all week. Today I had more pressure. she ahs not had a full bowel movment today. When constipated will leak more.    Patient Stated Goals No change with the  pressure.    Currently in Pain? No/denies                               [facility] Adult PT Treatment/Exercise - 12/01/20 0001       Lumbar Exercises: Standing   Other Standing Lumbar Exercises standing bil. shoulder extension with red band and 1 hip felxion with pelvic floor engaged    Other Standing Lumbar Exercises standing hip adduction with red band 10x each side; side plank 15x each side with gluteal and anal contraction      Manual Therapy   Manual Therapy Soft tissue mobilization;Myofascial release    Soft tissue mobilization circular massage to the abdomen to promote preistalic motion of the intestines; educated patient on how to perform herself; colonic motin massage starting at the left lower quadrant, then left upper quadrant then the right lateral abdominals    Myofascial Release fascial rlease of the right uppper quadrant due to the firmsness of the tissue                     PT Education - 12/01/20 1356     Education Details Access Code: [email or web address] ; abdominal massage    Person(s) Educated Patient    Methods Explanation;Demonstration;Handout  Comprehension Returned demonstration;Verbalized understanding              PT Short Term Goals - 10/27/20 1318       PT SHORT TERM GOAL #1   Title independent with diaphgramatic breathing, bracing of the abdomen with minimal doming, and  low level pelvic floor contraction    Time 4    Period Weeks    Status Achieved      PT SHORT TERM GOAL #2   Title education on how to manage prolapse and vulvar care    Time 4    Period Weeks    Status Achieved      PT SHORT TERM GOAL #3   Title educated on correct toileting techniques to reduce strain on abdomen and pelvic floor    Time 4    Period Weeks    Status Achieved      PT SHORT TERM GOAL #4   Title education on vaginal moisturizers and lubricants for vaginal health    Time 4    Period Weeks    Status Achieved               PT  Long Term Goals - 12/01/20 1403       PT LONG TERM GOAL #1   Title independent with advanced HEP for core strength and pelvic floor strength    Time 4    Period Months    Status On-going      PT LONG TERM GOAL #2   Title straining to have a bowel movement decreased >/= 50% due to improve strength and coordination of the pelvic floor    Baseline the stool is loose and liquidly; has not had a bowel movement since 6 days    Time 4    Period Months    Status Achieved      PT LONG TERM GOAL #4   Title able to reduce gas leakage >/= 60% due to improve coordination and control of the pelvic floor muscles    Baseline still very gassy. Gets trapped gas on the right side    Time 4    Period Months    Status On-going                   Plan - 12/01/20 1358     Clinical Impression Statement Patient was doing well until yesterday when she had increased mucous and stool leakage and not having a full bowel movement. Patient and therapist decided to continue her strengthening of the core and anus to reduce the leakage and prepare her for her posterior wall weakness. Patient had hardness in the left upper quadrant after the manual work but the entire abdomen had decreased swelling. Patient was educated on how to perform her abdominal massage at home to reduce her abdominal swelling and constipation. Patient exercises were changed so she is not doing them laying down due to her dizzyness. Patient will benefit from skilled therapy to improve core and anal strength.    Personal Factors and Comorbidities Age;Sex;Comorbidity 3+;Fitness;Past/Current Experience;Time since onset of injury/illness/exacerbation    Comorbidities Hysterectomy; Diabetes; umbilical hernia    Examination-Activity Limitations Bed Mobility;Carry;Continence;Lift;Toileting    Examination-Participation Restrictions Cleaning;Community Activity;Interpersonal Relationship;Laundry    Stability/Clinical Decision Making  Evolving/Moderate complexity    Rehab Potential Good    PT Frequency 1x / week    PT Duration Other (comment)   4 months   PT Treatment/Interventions ADLs/Self Care Home Management;Electrical Stimulation;Cryotherapy;Moist Heat;Therapeutic activities;Therapeutic exercise;Neuromuscular re-education;Patient/family education;Manual  techniques;Taping;Spinal Manipulations    PT Next Visit Plan continue to work on abdominal strength and pelvic floor strength; wall bird dog with pushing into a ball; dead bug in standing; abdominal massage    PT Home Exercise Plan Access Code: [email or web address]    Consulted and Agree with Plan of Care Patient             Patient will benefit from skilled therapeutic intervention in order to improve the following deficits and impairments:  Decreased endurance, Impaired tone, Decreased activity tolerance, Decreased strength, Increased fascial restricitons, Pain, Increased muscle spasms, Decreased coordination  Visit Diagnosis: Muscle weakness (generalized)  Other lack of coordination     Problem List Patient Active Problem List   Diagnosis Date Noted   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[place]) 123456   Umbilical hernia without obstruction and without gangrene 10/09/2020   Diastasis recti 10/09/2020   Perianal dermatitis 10/09/2020   Type 2 diabetes mellitus with diabetic polyneuropathy, without long-term current use of insulin ([place]) 11/29/2019   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

## 2020-12-01 NOTE — Patient Instructions (Addendum)
About Abdominal Massage  Abdominal massage, also called external colon massage, is a self-treatment circular massage technique that can reduce and eliminate gas and ease constipation. The colon naturally contracts in waves in a clockwise direction starting from inside the right hip, moving up toward the ribs, across the belly, and down inside the left hip.  When you perform circular abdominal massage, you help stimulate your colon's normal wave pattern of movement called peristalsis.  It is most beneficial when done after eating.  Positioning You can practice abdominal massage with oil while lying down, or in the shower with soap.  Some people find that it is just as effective to do the massage through clothing while sitting or standing.  How to Massage Start by placing your finger tips or knuckles on your right side, just inside your hip bone.  Make small circular movements while you move upward toward your rib cage.   Once you reach the bottom right side of your rib cage, take your circular movements across to the left side of the bottom of your rib cage.  Next, move downward until you reach the inside of your left hip bone.  This is the path your feces travel in your colon. Continue to perform your abdominal massage in this pattern for 10 minutes each day.     You can apply as much pressure as is comfortable in your massage.  Start gently and build pressure as you continue to practice.  Notice any areas of pain as you massage; areas of slight pain may be relieved as you massage, but if you have areas of significant or intense pain, consult with your healthcare provider.  Other Considerations General physical activity including bending and stretching can have a beneficial massage-like effect on the colon.  Deep breathing can also stimulate the colon because breathing deeply activates the same nervous system that supplies the colon.   Abdominal massage should always be used in combination with a  bowel-conscious diet that is high in the proper type of fiber for you, fluids (primarily water), and a regular exercise program.   Access Code: [email or web address] URL: [email or web address] Date: 12/01/2020 Prepared by: Earlie Counts  Program Notes lay on side, breath out as you slide your hand down your leg 15x    Exercises Hooklying Transversus Abdominis Palpation - 3 x daily - 7 x weekly - 1 sets - 10 reps Cat Cow - 1 x daily - 7 x weekly - 1 sets - 15 reps Sidelying Open Book Thoracic Lumbar Rotation and Extension - 1 x daily - 3 x weekly - 1 sets - 10 reps Beginner Bridge - 1 x daily - 4 x weekly - 1 sets - 10 reps Supine ASLR with Ab Bracing and Shoulder Extension with Anchored Resistance - 1 x daily - 4 x weekly - 1 sets - 10 reps Standing Anti-Rotation Press with Anchored Resistance - 1 x daily - 4 x weekly - 3 sets - 10 reps Squat with Chair Touch - 1 x daily - 4 x weekly - 1 sets - 10 reps Standing Plank on Wall - 1 x daily - 4 x weekly - 1 sets - 4 reps - 15 sec hold Standing Full Side Plank on Wall - 1 x daily - 4 x weekly - 1 sets - 4 reps - 15 sec hold Standing Repeated Hip Adduction with Resistance - 1 x daily - 7 x weekly - 2 sets - 10 reps [facility] [address]  [place], [place] [address] Phone # [phone] Fax [phone]

## 2020-12-10 ENCOUNTER — Telehealth: Payer: Self-pay | Admitting: Internal Medicine

## 2020-12-10 NOTE — Telephone Encounter (Signed)
I will send progress note to [facility] when I receive form

## 2020-12-10 NOTE — Telephone Encounter (Signed)
[facility] is calling in regards to the Dexcom transmitter/sensors prescription form to be completed, A1C, 30day BG logs. [phone]

## 2020-12-19 DIAGNOSIS — U071 COVID-19: Secondary | ICD-10-CM

## 2020-12-19 HISTORY — DX: COVID-19: U07.1

## 2020-12-22 ENCOUNTER — Encounter: Payer: Self-pay | Admitting: Physical Therapy

## 2020-12-22 ENCOUNTER — Other Ambulatory Visit: Payer: Self-pay

## 2020-12-22 ENCOUNTER — Encounter: Payer: BC Managed Care – PPO | Attending: Obstetrics and Gynecology | Admitting: Physical Therapy

## 2020-12-22 DIAGNOSIS — N393 Stress incontinence (female) (male): Secondary | ICD-10-CM | POA: Diagnosis present

## 2020-12-22 DIAGNOSIS — R159 Full incontinence of feces: Secondary | ICD-10-CM | POA: Diagnosis present

## 2020-12-22 DIAGNOSIS — M6281 Muscle weakness (generalized): Secondary | ICD-10-CM

## 2020-12-22 DIAGNOSIS — R278 Other lack of coordination: Secondary | ICD-10-CM

## 2020-12-22 NOTE — Therapy (Signed)
[place] at [facility] [address] [place], [place], [address] Phone: [phone]   Fax:  [phone]  Physical Therapy Treatment  Patient Details  Name: Kristine Callahan MRN: [id number] Date of Birth: 09-05-1965 Referring Provider (PT): Dr. 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   Subjective Assessment - 12/22/20 1258     Subjective The PT exrecises ar coming along well. Fecal leakage is minimal. My stomach is not as bloated. NO rectal pain. Reduction of pressure and feels it more when is constipated.    Patient Stated Goals No change with the pressure.    Currently in Pain? No/denies                            Pelvic Floor Special Questions - 12/22/20 0001     Pelvic Floor Internal Exam Patient confirms identification and approves PT to assess pelvic floor and treatment.     Exam Type Rectal    Strength fair squeeze, definite lift               [facility] Adult PT Treatment/Exercise - 12/22/20 0001       Neuro Re-ed    Neuro Re-ed Details  using the therapist finger into he anal canal and give tactile cue for a full anal contraction and she was able to feel the lift in sidely and sitting holfing for 30 seconds      Exercises   Exercises Other Exercises    Other Exercises  verbally reviewed patient exercise program and fine tuned to work the anal canal better      Lumbar Exercises: Standing   Other Standing Lumbar Exercises standing bil. shoulder extension with red band with anal contraction and tactile cues to engage the scapula retractors.      Manual Therapy   Manual Therapy Internal Pelvic Floor    Internal Pelvic Floor using the [place] harvest reveleum with manual work to the external anal sphincter to release the restrictions and give tactile cue to fully engage the external anal sphincter                     PT Education - 12/22/20 1348  Education Details Access Code: [email or web address]    Person(s) Educated Patient    Methods Explanation;Demonstration;Verbal cues;Handout    Comprehension Returned demonstration;Verbalized understanding              PT Short Term Goals - 10/27/20 1318       PT SHORT TERM GOAL #1   Title independent with diaphgramatic breathing, bracing of the abdomen with minimal doming, and  low level pelvic floor contraction    Time 4    Period Weeks    Status Achieved      PT SHORT TERM GOAL #2   Title education on how to manage prolapse and vulvar care    Time 4    Period Weeks    Status Achieved      PT SHORT TERM GOAL #3   Title educated on correct toileting techniques to reduce strain on abdomen and pelvic floor    Time 4    Period Weeks    Status Achieved      PT SHORT TERM GOAL #4   Title education on vaginal moisturizers and lubricants for vaginal health    Time 4    Period Weeks    Status  Achieved               PT Long Term Goals - 12/22/20 1307       PT LONG TERM GOAL #1   Title independent with advanced HEP for core strength and pelvic floor strength    Time 4    Period Months    Status On-going      PT LONG TERM GOAL #2   Title straining to have a bowel movement decreased >/= 50% due to improve strength and coordination of the pelvic floor    Baseline the stool is loose and liquidly; has not had a bowel movement since 6 days    Time 4    Period Months    Status Achieved      PT LONG TERM GOAL #3   Title fecal leakage decreased >/= 60% due to increased pelvic floro tone and strength >/= 3/5 holding for 10 seconds    Baseline 70% better; today had a very small aomount of fecal lakage with pressure. She still gets the clear liquid discharge and that has reduced    Time 4    Period Months    Status Achieved      PT LONG TERM GOAL #4   Title able to reduce gas leakage >/= 60% due to improve coordination and control of the pelvic floor muscles    Baseline still very gassy. Gets trapped gas on the right side    Time 4    Period Months    Status On-going                   Plan - 12/22/20 1315     Clinical Impression Statement Patient had some fascial restrictions around the anal sphincters and they were able to contract better after the resctrictions were released. Patient was able to feel the anus lift upward with some lower abdominal contraction. Anal, contraction is 3/5 instead of 2/5 due to a weak lift. Patient is passing less gas and has had less leakage. She is able to engage her abdoninals better. Patient is not having rectal pain just pressure when she has the urge to have a bowel movement or constipated. She is working on contracting the anus for 30 seconds to build up the endurance. Patient will  benefit from skilled therapy to to improve core and anal strength.    Personal Factors and Comorbidities Age;Sex;Comorbidity 3+;Fitness;Past/Current  Experience;Time since onset of injury/illness/exacerbation    Comorbidities Hysterectomy; Diabetes; umbilical hernia    Examination-Activity Limitations Bed Mobility;Carry;Continence;Lift;Toileting    Examination-Participation Restrictions Cleaning;Community Activity;Interpersonal Relationship;Laundry    Stability/Clinical Decision Making Evolving/Moderate complexity    Rehab Potential Good    PT Frequency 1x / week    PT Next Visit Plan work on contracting 30 seconds, see if she is ready for discharge, review HEP    PT Home Exercise Plan Access Code: [email or web address]    Consulted and Agree with Plan of Care Patient             Patient will benefit from skilled therapeutic intervention in order to improve the following deficits and impairments:  Decreased endurance, Impaired tone, Decreased activity tolerance, Decreased strength, Increased fascial restricitons, Pain, Increased muscle spasms, Decreased coordination  Visit Diagnosis: Muscle weakness (generalized)  Other lack of coordination     Problem List Patient Active Problem List   Diagnosis Date Noted   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[place]) 30/09/6224   Umbilical hernia without obstruction and without gangrene 10/09/2020   Diastasis recti 10/09/2020   Perianal dermatitis 10/09/2020   Type 2 diabetes mellitus with diabetic polyneuropathy, without long-term current use of insulin ([place]) 11/29/2019   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

## 2020-12-22 NOTE — Patient Instructions (Signed)
Access Code: [email or web address] URL: [email or web address] Date: 12/22/2020 Prepared by: Earlie Counts  Program Notes lay on side, breath out as you slide your hand down your leg 15x    Exercises Cat Cow - 1 x daily - 7 x weekly - 1 sets - 15 reps Standing Anti-Rotation Press with Anchored Resistance - 1 x daily - 4 x weekly - 3 sets - 10 reps Sit to Stand Without Arm Support - 1 x daily - 4 x weekly - 1 sets - 10 reps Supine March - 1 x daily - 4 x weekly - 3 sets - 10 reps Standing Repeated Hip Adduction with Resistance - 1 x daily - 4 x weekly - 3 sets - 10 reps Standing Plank on Wall - 1 x daily - 4 x weekly - 1 sets - 5 reps - work towards 30 sec hold Standing Shoulder Extension with Resistance - 1 x daily - 4 x weekly - 2 sets - 10 reps  Earlie Counts, PT [facility] [address], [place], [place] [address] W: [phone] [email or web address]

## 2021-01-05 ENCOUNTER — Telehealth: Payer: Self-pay | Admitting: Internal Medicine

## 2021-01-05 NOTE — Telephone Encounter (Signed)
[facility] calling to request CMN and log notes for Dexcom G6. Please fill out and fax back [phone]

## 2021-01-06 NOTE — Telephone Encounter (Signed)
Forms have been faxed again with office notes

## 2021-01-07 ENCOUNTER — Encounter: Payer: BC Managed Care – PPO | Admitting: Physical Therapy

## 2021-01-07 ENCOUNTER — Encounter: Payer: Self-pay | Admitting: Physical Therapy

## 2021-01-07 ENCOUNTER — Other Ambulatory Visit: Payer: Self-pay

## 2021-01-07 DIAGNOSIS — R159 Full incontinence of feces: Secondary | ICD-10-CM | POA: Diagnosis not present

## 2021-01-07 DIAGNOSIS — M6281 Muscle weakness (generalized): Secondary | ICD-10-CM

## 2021-01-07 DIAGNOSIS — R278 Other lack of coordination: Secondary | ICD-10-CM

## 2021-01-07 NOTE — Therapy (Signed)
[place] at [facility] [address] [place], [place], [address] Phone: [phone]   Fax:  [phone]  Physical Therapy Treatment  Patient Details  Name: Kristine Callahan MRN: [id number] Date of Birth: 05-08-1965 Referring Provider (PT): Dr. 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   Headache    hx migraines none in years   Heart murmur, systolic    Negative echocardiogram 2016   Hypertension    Obesity    Prolapse of 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    Umbilical hernia    Uncontrolled type 2 diabetes mellitus with hyperglycemia, with long-term current use of insulin ([place]) 11/29/2019    Past Surgical History:  Procedure Laterality Date   CHOLECYSTECTOMY     COLONOSCOPY WITH PROPOFOL N/A 05/31/2016   Procedure: COLONOSCOPY WITH PROPOFOL;  Surgeon: Garlan Fair, MD;  [facility];  Service: Endoscopy;  Laterality: N/A;    There were no vitals filed for this visit.   Subjective Assessment - 01/07/21 1038     Subjective None now. Had while I was sick due to the medicine I was taking. right now no bloating of abdomen. The anus was swollen and distended with manual work last visit.  I had some fecal leakage today.    Patient Stated Goals No change with the pressure.    Currently in Pain? No/denies                [facility] PT Assessment - 01/07/21 0001       Assessment   Medical Diagnosis R15.9 Full incontinence of feces; N39.3 Stress incontinence    Referring Provider (PT) Dr.  Sherlene Shams    Onset Date/Surgical Date --   past 2 years   Prior Therapy none      Precautions   Precautions None      Restrictions   Weight Bearing Restrictions No      Home Environment   Living Environment Private residence      Prior Function   Level of Independence Independent    Vocation Other (comment)   does not work   Leisure trying to get back inot walking, tried jogging but leak out of rectum, tries some wt. lifting for 4 times per week      Cognition   Overall Cognitive Status Within Functional Limits for tasks assessed      Posture/Postural Control   Posture/Postural Control No significant limitations      ROM / Strength   AROM / PROM / Strength AROM;PROM;Strength      AROM   Overall AROM Comments lumbar ROM is full      Strength   Right Hip ABduction 4-/5    Left Hip ABduction 4-/5                        Pelvic Floor Special Questions - 01/07/21 0001     Number  of Pregnancies 5    Number of Vaginal Deliveries 3    Any difficulty with labor and deliveries Yes   she had stitiches with her third child   Diastasis Recti umbilical hernia; 4 finger width above the umbilicus, 2 fingers below the umbilicus    Currently Sexually Active No   due to the rash, fecal leakage   Marinoff Scale --   pain with deeper penetration, dryness   Urinary Leakage No    Pad use no pads    Other activities that cause leaking when the perineal and rectal area are irritated    Urinary urgency No    Fecal incontinence Yes   fecal leakage increased during covid and has slowed down in the past few days   Falling out feeling (prolapse) Yes    Exam Type Deferred   too much pain after last time   Strength fair squeeze, definite lift    Strength # of seconds 15               [facility] Adult PT Treatment/Exercise - 01/07/21 0001       Lumbar Exercises: Stretches   Quadruped Mid Back Stretch 1 rep;30 seconds    Quadruped Mid Back Stretch Limitations childs  pose      Lumbar Exercises: Standing   Other Standing Lumbar Exercises sit to stand 15x;    Other Standing Lumbar Exercises wall plank holding for 5 sec 5 times      Lumbar Exercises: Supine   Ab Set 10 reps;5 seconds    Bent Knee Raise 10 reps;1 second    Bent Knee Raise Limitations wiht abdominal contraction, legs were shaking    Other Supine Lumbar Exercises pelvic floor contractions 15 sec 5 times      Lumbar Exercises: Quadruped   Madcat/Old Horse 15 reps    Madcat/Old Horse Limitations went slowly                     PT Education - 01/07/21 1118     Education Details Access Code: [email or web address]    Person(s) Educated Patient    Methods Explanation;Demonstration;Verbal cues;Handout    Comprehension Returned demonstration;Verbalized understanding              PT Short Term Goals - 01/07/21 1122       PT SHORT TERM GOAL #1   Title independent with diaphgramatic breathing, bracing of the abdomen with minimal doming, and  low level pelvic floor contraction    Time 4    Period Weeks    Status Achieved      PT SHORT TERM GOAL #2   Title education on how to manage prolapse and vulvar care    Time 4    Period Weeks    Status Achieved      PT SHORT TERM GOAL #3   Title educated on correct toileting techniques to reduce strain on abdomen and pelvic floor    Time 4    Period Weeks    Status Achieved      PT SHORT TERM GOAL #4   Title education on vaginal moisturizers and lubricants for vaginal health    Time 4    Period Weeks    Status Achieved               PT Long Term Goals - 01/07/21 1123       PT LONG TERM GOAL #1   Title independent with advanced HEP for core strength and  pelvic floor strength    Time 4    Period Months    Status Achieved      PT LONG TERM GOAL #2   Title straining to have a bowel movement decreased >/= 50% due to improve strength and coordination of the pelvic floor    Baseline --    Time 4    Period Months    Status  Achieved      PT LONG TERM GOAL #3   Title fecal leakage decreased >/= 60% due to increased pelvic floro tone and strength >/= 3/5 holding for 10 seconds    Baseline --    Time 4    Period Months    Status Achieved      PT LONG TERM GOAL #4   Title able to reduce gas leakage >/= 60% due to improve coordination and control of the pelvic floor muscles    Baseline still very gassy. Gets trapped gas on the right side    Time 4    Period Months    Status Not Met                   Plan - 01/07/21 1119     Clinical Impression Statement Patient had COVID since last visit. It has caused constipation making her have increased discomfort and straining to have a bowel movement. She had increased fecal leakage during COVID and now has decreased. Patient had difficulty with doing her exercises due ot the fatique level from COVID. Patient is not having urinary leakage or the urge to void. Patient anal strength was 3/5 holding for 15 seconds. she is able to do her exercises with good lower abdominal contraction and pelvic floor. Patient is not having the rectal pain or bloating since she had COVID. Paitent is ready to be discharged to HEP and may have surgery to repair the rectal prolapse.    Personal Factors and Comorbidities Age;Sex;Comorbidity 3+;Fitness;Past/Current Experience;Time since onset of injury/illness/exacerbation    Comorbidities Hysterectomy; Diabetes; umbilical hernia    Examination-Activity Limitations Bed Mobility;Carry;Continence;Lift;Toileting    Examination-Participation Restrictions Cleaning;Community Activity;Interpersonal Relationship;Laundry    Stability/Clinical Decision Making Evolving/Moderate complexity    Rehab Potential Good    PT Treatment/Interventions ADLs/Self Care Home Management;Electrical Stimulation;Cryotherapy;Moist Heat;Therapeutic activities;Therapeutic exercise;Neuromuscular re-education;Patient/family education;Manual techniques;Taping;Spinal  Manipulations    PT Next Visit Plan Discharge to HEP    PT Home Exercise Plan Access Code: [email or web address]    Consulted and Agree with Plan of Care Patient             Patient will benefit from skilled therapeutic intervention in order to improve the following deficits and impairments:  Decreased endurance, Impaired tone, Decreased activity tolerance, Decreased strength, Increased fascial restricitons, Pain, Increased muscle spasms, Decreased coordination  Visit Diagnosis: Muscle weakness (generalized)  Other lack of coordination     Problem List Patient Active Problem List   Diagnosis Date Noted   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[employer]) 11/91/4782   Umbilical hernia without obstruction and without gangrene 10/09/2020   Diastasis recti 10/09/2020   Perianal dermatitis 10/09/2020   Type 2 diabetes mellitus with diabetic polyneuropathy, without long-term current use of insulin ([place]) 11/29/2019   Type 2 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, without long-term current use of insulin ([place]) 11/29/2019   Uncontrolled type 2 diabetes mellitus with hyperglycemia, with long-term current use of insulin ([place]) 11/29/2019   Dyslipidemia 11/29/2019    Earlie Counts, PT 01/07/21 11:25 AM  [facility] [address], [place], [place], [address] Phone: [phone]   Fax:  [phone]  Name: Kristine Callahan MRN: [id number] Date of Birth: 1965/07/04  PHYSICAL THERAPY DISCHARGE SUMMARY  Visits from Start of Care: 11  Current functional level related to goals / functional outcomes: See above.    Remaining deficits: See above.    Education / Equipment: HEP   Patient agrees to discharge. Patient goals were  all goals met but 1 . Patient is being discharged due to meeting the stated rehab goals. Thank you for the referral. Earlie Counts, PT 01/07/21 11:26 AM

## 2021-01-07 NOTE — Patient Instructions (Signed)
Access Code: [email or web address] URL: [email or web address] Date: 01/07/2021 Prepared by: Earlie Counts  Program Notes lay on side, breath out as you slide your hand down your leg 15x    Exercises Cat Cow - 1 x daily - 7 x weekly - 1 sets - 15 reps Standing Anti-Rotation Press with Anchored Resistance - 1 x daily - 4 x weekly - 3 sets - 10 reps Sit to Stand Without Arm Support - 1 x daily - 4 x weekly - 1 sets - 10 reps Supine March - 1 x daily - 4 x weekly - 3 sets - 10 reps Standing Repeated Hip Adduction with Resistance - 1 x daily - 4 x weekly - 3 sets - 10 reps Standing Plank on Wall - 1 x daily - 4 x weekly - 1 sets - 5 reps - work towards 30 sec hold Standing Shoulder Extension with Resistance - 1 x daily - 4 x weekly - 2 sets - 10 reps   Earlie Counts, PT [facility] [address] [place],  [address] W: [phone] [email or web address]

## 2021-01-08 ENCOUNTER — Ambulatory Visit (INDEPENDENT_AMBULATORY_CARE_PROVIDER_SITE_OTHER): Payer: BC Managed Care – PPO | Admitting: Obstetrics and Gynecology

## 2021-01-08 ENCOUNTER — Encounter: Payer: Self-pay | Admitting: Obstetrics and Gynecology

## 2021-01-08 VITALS — BP 164/78 | HR 73

## 2021-01-08 DIAGNOSIS — Z01818 Encounter for other preprocedural examination: Secondary | ICD-10-CM

## 2021-01-08 MED ORDER — ACETAMINOPHEN 500 MG PO TABS: 500.0000 mg | ORAL_TABLET | Freq: Four times a day (QID) | ORAL | 0 refills | Status: DC | PRN

## 2021-01-08 MED ORDER — IBUPROFEN 600 MG PO TABS: 600.0000 mg | ORAL_TABLET | Freq: Four times a day (QID) | ORAL | 0 refills | Status: DC | PRN

## 2021-01-08 MED ORDER — POLYETHYLENE GLYCOL 3350 17 GM/SCOOP PO POWD: 17.0000 g | Freq: Every day | ORAL | 0 refills | Status: AC

## 2021-01-08 MED ORDER — DOCUSATE SODIUM 100 MG PO CAPS: 100.0000 mg | ORAL_CAPSULE | Freq: Two times a day (BID) | ORAL | 0 refills | Status: AC

## 2021-01-08 MED ORDER — OXYCODONE HCL 5 MG PO TABS: 5.0000 mg | ORAL_TABLET | ORAL | 0 refills | Status: DC | PRN

## 2021-01-08 NOTE — Progress Notes (Signed)
[facility] Pre-Operative visit  Subjective Chief Complaint: Kristine Callahan presents for a preoperative encounter.   History of Present Illness: Kristine Callahan is a 55 y.o. female who presents for preoperative visit.  She is scheduled to undergo posterior repair, perineorrhaphy, possible sacrospinous ligament fixation on 01/21/21.  Her symptoms include vaginal bulge, and she was was found to have Stage I anterior, Stage II posterior, Stage I apical prolapse.  Urodynamics showed: 1. Sensation was normal; capacity was normal 2. Stress Incontinence was not demonstrated; 3. Detrusor Overactivity was not demonstrated. 4. Emptying was normal with a normal PVR, a sustained detrusor contraction present,  abdominal straining not present, normal urethral sphincter activity on EMG.  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   Family History  Adopted: Yes    Social History   Tobacco Use   Smoking status: Never   Smokeless tobacco: Never  Vaping Use   Vaping Use: Never used  Substance Use Topics   Alcohol use: Yes    Comment: very rare   Drug use: No     Review of Systems was negative for a full 10 system review except as noted in the History of Present Illness.   Current Outpatient Medications:    aspirin 81 MG tablet, Take 81 mg by mouth daily., Disp: ,  Rfl:    Blood Glucose Monitoring Suppl (ACCU-CHEK GUIDE ME) w/Device KIT, by Does not apply route., Disp: , Rfl:    Continuous Blood Gluc Sensor (DEXCOM G6 SENSOR) MISC, 1 Device by Does not apply route as directed., Disp: 9 each, Rfl: 3   Continuous Blood Gluc Transmit (DEXCOM G6 TRANSMITTER) MISC, 1 Device by Does not apply route as directed., Disp: 1 each, Rfl: 3   dapagliflozin propanediol (FARXIGA) 10 MG TABS tablet, Take 1 tablet (10 mg total) by mouth daily., Disp: 90 tablet, Rfl: 3   fenofibrate 160 MG tablet, Take 160 mg by mouth daily., Disp: , Rfl:    glucose blood (ACCU-CHEK GUIDE) test strip, 1 each by Other route as needed for other. Use as instructed, Disp: , Rfl:    insulin aspart (NOVOLOG FLEXPEN) 100 UNIT/ML FlexPen, Max daily 60 units, Disp: 60 mL, Rfl: 1   insulin degludec (TRESIBA FLEXTOUCH) 100 UNIT/ML FlexTouch Pen, Inject 30 Units into the skin daily., Disp: 30 mL, Rfl: 3   Insulin Pen Needle 32G X 4 MM MISC, 1 Device by Does not apply route in the morning, at noon, in the evening, and at bedtime., Disp: 400 each, Rfl: 2   liraglutide (VICTOZA) 18 MG/3ML SOPN, Inject 1.8 mg into the skin daily., Disp: 27 mL, Rfl: 3   lisinopril-hydrochlorothiazide (ZESTORETIC) 20-25 MG tablet, Take 1 tablet by mouth daily., Disp: , Rfl:    mesalamine (CANASA) 1000 MG suppository, Place 1 suppository (1,000 mg total) rectally  at bedtime., Disp: 30 suppository, Rfl: 12   metFORMIN (GLUCOPHAGE) 1000 MG tablet, Take 1 tablet (1,000 mg total) by mouth 2 (two) times daily with a meal., Disp: 180 tablet, Rfl: 3   Nystatin (GERHARDT'S BUTT CREAM) CREA, Apply 2-4 x a day to perianal rash, Disp: 1 each, Rfl: 3   nystatin-triamcinolone ointment (MYCOLOG), Apply 1 application topically 2 (two) times daily., Disp: 60 g, Rfl: 1   rosuvastatin (CRESTOR) 5 MG tablet, Take 5 mg by mouth daily., Disp: , Rfl:    hydrocortisone (ANUSOL-HC) 2.5 % rectal cream, Place 1 application rectally 2 (two) times daily.,  Disp: 30 g, Rfl: 1   Objective Today's Vitals   01/08/21 1418  BP: (!) 164/78  Pulse: 73   There is no height or weight on file to calculate BMI. Gen: AAO x3  Previous Pelvic Exam showed: POP-Q (07/14/20):    POP-Q   -2                                            Aa   -2                                           Ba   -5.5                                              C    4.5                                            Gh   4                                            Pb   10                                            tvl    0                                            Ap   0                                            Bp   -8                                              D         Assessment/ Plan  Assessment: The patient is a 55 y.o.  year old scheduled to undergo posterior repair, perineorrhaphy, possible sacrospinous ligament fixation. Verbal consent was obtained for these procedures.  Plan: General Surgical Consent: The patient has previously been counseled on alternative treatments, and the decision by the patient and provider was to proceed with the procedure listed above.  For all procedures, there are risks of bleeding, infection, damage to surrounding organs including but not limited to bowel, bladder, blood vessels, ureters and nerves, and need for further surgery if an injury were to occur. These risks are all low with minimally invasive surgery.   There are risks of numbness and weakness at any body site or buttock/rectal pain.  It is possible that baseline pain can be worsened by surgery, either with or without mesh. If surgery is vaginal, there is also a low risk of possible conversion to laparoscopy or open abdominal incision where indicated. Very rare risks include blood transfusion, blood clot, heart attack, pneumonia, or death.   There is also a risk of short-term postoperative urinary retention with need to use a catheter. About half of patients need  to go home from surgery with a catheter, which is then later removed in the office. The risk of long-term need for a catheter is very low. There is also a risk of worsening of overactive bladder.    Prolapse (with or without mesh): Risk factors for surgical failure  include things that put pressure on your pelvis and the surgical repair, including obesity, chronic cough, and heavy lifting or straining (including lifting children or adults, straining on the toilet, or lifting heavy objects such as furniture or anything weighing >25 lbs. Risks of recurrence is 20-30% with vaginal native tissue repair and a less than 10% with sacrocolpopexy with mesh.    We discussed consent for blood products. Risks for blood transfusion include allergic reactions, other reactions that can affect different body organs and managed accordingly, transmission of infectious diseases such as HIV or Hepatitis. However, the blood is screened. Patient consents for blood products.  Pre-operative instructions:  She was instructed to not take Aspirin/NSAIDs x 7days prior to surgery. She may continue her $RemoveBefo'81mg'AQhXkolWbwA$  ASA. Antibiotic prophylaxis was ordered as indicated.  Cathter use: Patient will go home with foley if needed after post-operative voiding trial.  Post-operative instructions:  She was provided with specific post-operative instructions, including precautions and signs/symptoms for which we would recommend contacting us, in addition to daytime and after-hours contact phone numbers. This was provided on a handout.   Post-operative medications: Prescriptions for motrin, tylenol, miralax, and oxycodone will be sent to her pharmacy. Discussed using ibuprofen and tylenol on a schedule to limit use of narcotics.   Laboratory testing:  Having repeat Hgb A1c next week. We discussed that it needs to be <8 to proceed with surgery.   Preoperative clearance:  She does require surgical clearance- from her PCP. Letter sent in August, will  follow up with their office.    Post-operative follow-up:  A post-operative appointment will be made for 6 weeks from the date of surgery. If she needs a post-operative nurse visit for a voiding trial, that will be set up after she leaves the hospital.    Patient will call the clinic or use MyChart should anything change or any new issues arise.   [name], MD

## 2021-01-13 ENCOUNTER — Other Ambulatory Visit: Payer: Self-pay

## 2021-01-13 ENCOUNTER — Encounter: Payer: Self-pay | Admitting: Internal Medicine

## 2021-01-13 ENCOUNTER — Ambulatory Visit (INDEPENDENT_AMBULATORY_CARE_PROVIDER_SITE_OTHER): Payer: BC Managed Care – PPO | Admitting: Internal Medicine

## 2021-01-13 VITALS — BP 140/80 | HR 74 | Ht 70.0 in | Wt 224.0 lb

## 2021-01-13 DIAGNOSIS — E113593 Type 2 diabetes mellitus with proliferative diabetic retinopathy without macular edema, bilateral: Secondary | ICD-10-CM | POA: Diagnosis not present

## 2021-01-13 DIAGNOSIS — E1142 Type 2 diabetes mellitus with diabetic polyneuropathy: Secondary | ICD-10-CM

## 2021-01-13 LAB — GLUCOSE, POCT (MANUAL RESULT ENTRY): POC Glucose: 121 mg/dl — AB (ref 70–99)

## 2021-01-13 LAB — POCT GLYCOSYLATED HEMOGLOBIN (HGB A1C): Hemoglobin A1C: 7.1 % — AB (ref 4.0–5.6)

## 2021-01-13 NOTE — Patient Instructions (Signed)
-   Keep Up the Good Work ! - Continue Metformin 1000 mg Twice daily  - Continue Farxiga 10 mg daily  - Continue Victoza 1.8 mg daily  - Continue Tresiba  30 units daily  - Continue  Novolog 8 units with each meal  -Novolog correctional insulin: ADD extra units on insulin to your meal-time Novolog dose if your blood sugars are higher than 155. Use the scale below to help guide you:   Blood sugar before meal Number of units to inject  Less than 155 0 unit  156 -  180 1 units  181 -  205 2 units  206 -  230 3 units  231 -  255 4 units  256 -  280 5 units  281 -  305 6 units  306 -  330 7 units  331 -  355 8 units   HOLD Farxiga 48 hours before surgery , may resume after surgery    HOW TO TREAT LOW BLOOD SUGARS (Blood sugar LESS THAN 70 MG/DL) Please follow the RULE OF 15 for the treatment of hypoglycemia treatment (when your (blood sugars are less than 70 mg/dL)   STEP 1: Take 15 grams of carbohydrates when your blood sugar is low, which includes:  3-4 GLUCOSE TABS  OR 3-4 OZ OF JUICE OR REGULAR SODA OR ONE TUBE OF GLUCOSE GEL    STEP 2: RECHECK blood sugar in 15 MINUTES STEP 3: If your blood sugar is still low at the 15 minute recheck --> then, go back to STEP 1 and treat AGAIN with another 15 grams of carbohydrates.

## 2021-01-13 NOTE — Progress Notes (Signed)
Name: Kristine Callahan  Age/ Sex: 55 y.o., female   MRN/ DOB: [id number], [birth year]     PCP: Leeroy Cha, MD   Reason for Endocrinology Evaluation: Type 2 Diabetes Mellitus  Initial Endocrine Consultative Visit: T2DM and HTN    PATIENT IDENTIFIER: Kristine Callahan is a 55 y.o. female with a past medical history of HTN, T2 DM and ulcerative proctitis. The patient has followed with Endocrinology clinic since 11/29/2019 for consultative assistance with management of her diabetes.  DIABETIC HISTORY:  Kristine Callahan was diagnosed with DM yrs ago, she has been on Glipizide, insulin started 08/2019. Her hemoglobin A1c has ranged from 7.1% , peaking at 11.0%  in 2016.  On her initial visit to our clinic she had an A1c of 7.9% .  She was on Janumet, Farxiga, Victoza and basal insulin. We stopped Janumet , started Metformin, switched Levemir tresiba and continued [place] and Victoza.   She was lost to follow-up until her return and 11/2020 with an A1c of 8.3%  SUBJECTIVE:   During the last visit (11/25/2020): A1c 8.3 % we started prandial insulin , continued metformin, farxiga and victoza    Today (01/13/2021): Kristine Callahan is here for a follow up on diabetes management.She checks her glucose 3-4 times a day,  she did not bring her meter today   She had COVID 2 weeks ago   She has been diagnosed with ulcerative proctitis , she has pending  rectocele repair 01/2021, she wants to optimize her A1c prior to sx.   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   Headache    hx migraines none in years   Heart murmur, systolic    Negative echocardiogram 2016   Hypertension    Obesity    Prolapse of 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    Umbilical hernia    Uncontrolled type 2 diabetes mellitus with hyperglycemia, with long-term current use of insulin ([place]) 11/29/2019   Past Surgical History:  Past Surgical History:  Procedure Laterality Date   CHOLECYSTECTOMY     COLONOSCOPY WITH PROPOFOL N/A 05/31/2016   Procedure: COLONOSCOPY WITH PROPOFOL;  Surgeon: Garlan Fair, MD;  Location: [facility];  Service: Endoscopy;  Laterality: N/A;   Social History:  reports that she has never smoked. She has never used smokeless tobacco. She reports current alcohol use. She reports that she does not use drugs. Family History:  Family History  Adopted: Yes     HOME MEDICATIONS: Allergies as of 01/13/2021       Reactions   Latex Other (See Comments)   Taste sensation         Medication List        Accurate as of January 13, 2021  9:58 AM. If you have any questions, ask your nurse or doctor.          Accu-Chek Guide Me w/Device Kit by Does not  apply route.   Accu-Chek Guide test strip Generic drug: glucose blood 1 each by Other route as needed for other. Use as instructed   acetaminophen 500 MG tablet Commonly known as: TYLENOL Take 1 tablet (500 mg total) by mouth every 6 (six) hours as needed (pain).   aspirin 81 MG tablet Take 81 mg by mouth daily.   dapagliflozin propanediol 10 MG Tabs tablet Commonly known as: FARXIGA Take 1 tablet (10 mg total) by mouth daily.   Dexcom G6 Sensor Misc 1 Device by Does not apply route as directed.   Dexcom G6 Transmitter Misc 1 Device by Does not apply route as directed.   docusate sodium 100 MG capsule Commonly known as:  Colace Take 1 capsule (100 mg total) by mouth 2 (two) times daily.   fenofibrate 160 MG tablet Take 160 mg by mouth daily.   Gerhardt's butt cream Crea Apply 2-4 x a day to perianal rash   hydrocortisone 2.5 % rectal cream Commonly known as: ANUSOL-HC Place 1 application rectally 2 (two) times daily.   ibuprofen 600 MG tablet Commonly known as: ADVIL Take 1 tablet (600 mg total) by mouth every 6 (six) hours as needed.   Insulin Pen Needle 32G X 4 MM Misc 1 Device by Does not apply route in the morning, at noon, in the evening, and at bedtime.   lisinopril-hydrochlorothiazide 20-25 MG tablet Commonly known as: ZESTORETIC Take 1 tablet by mouth daily.   mesalamine 1000 MG suppository Commonly known as: CANASA Place 1 suppository (1,000 mg total) rectally at bedtime.   metFORMIN 1000 MG tablet Commonly known as: GLUCOPHAGE Take 1 tablet (1,000 mg total) by mouth 2 (two) times daily with a meal.   NovoLOG FlexPen 100 UNIT/ML FlexPen Generic drug: insulin aspart Max daily 60 units   nystatin-triamcinolone ointment Commonly known as: MYCOLOG Apply 1 application topically 2 (two) times daily.   oxyCODONE 5 MG immediate release tablet Commonly known as: Oxy IR/ROXICODONE Take 1 tablet (5 mg total) by mouth every 4 (four) hours as needed for severe pain.   polyethylene glycol powder 17 GM/SCOOP powder Commonly known as: GLYCOLAX/MIRALAX Take 17 g by mouth daily. Drink 17g (1 scoop) dissolved in water per day.   rosuvastatin 5 MG tablet Commonly known as: CRESTOR Take 5 mg by mouth daily.   Tyler Aas FlexTouch 100 UNIT/ML FlexTouch Pen Generic drug: insulin degludec Inject 30 Units into the skin daily.   Victoza 18 MG/3ML Sopn Generic drug: liraglutide Inject 1.8 mg into the skin daily.         OBJECTIVE:   Vital Signs: BP 140/80 (BP Location: Left Arm, Patient Position: Sitting, Cuff Size: Small)   Pulse 74   Ht $R'5\' 10"'gk$  (1.778 m)   Wt 224 lb (101.6 kg)   LMP   (LMP Unknown)   SpO2 99%   BMI 32.14 kg/m   Wt Readings from Last 3 Encounters:  01/13/21 224 lb (101.6 kg)  11/25/20 228 lb (103.4 kg)  11/13/20 230 lb (104.3 kg)     Exam: General: Pt appears well and is in NAD  Lungs: Clear with good BS bilat with no rales, rhonchi, or wheezes  Heart: RRR with normal S1 and S2 and no gallops; no murmurs; no rub  Abdomen: Normoactive bowel sounds, soft, nontender, without masses or organomegaly palpable  Extremities: No pretibial edema. No tremor. Normal strength and motion throughout. See detailed diabetic foot exam below.  Neuro: MS is good with appropriate affect, pt is alert and  Ox3    DM foot exam: 11/25/2020  The skin of the feet is intact without sores or ulcerations. The pedal pulses are 1+ on right and 1+ on left. The sensation is intact to a screening 5.07, 10 gram monofilament bilaterally          DATA REVIEWED:  Lab Results  Component Value Date   HGBA1C 7.1 (A) 01/13/2021   HGBA1C 8.3 (A) 11/25/2020   HGBA1C 7.9 (A) 11/29/2019     ASSESSMENT / PLAN / RECOMMENDATIONS:   1) Type 2 Diabetes Mellitus, Optimally controlled, With retinopathic and neuropathic  complications - Most recent A1c of 7.1 %. Goal A1c < 7.0 %.      - A1c down from 8.3 %  - Praised the pt on improved glycemic control, she was reminded to bring meter during future visit  - Pt is cleared from the diabetes stand point for surgical intervention  - She is still working with the DME company on obtaining the  Dexcom -No changes today  - She was advised to hold farxiga 48 hr before sx and resume after sx    MEDICATIONS: - Continue Metformin 1000 mg Twice daily  - Continue Farxiga 10 mg daily  - Continue Victoza 1.8 mg daily  - Continue Tresiba 30 units daily  - Continue Novolog 8 units with each meal  - CF: NovoLog (BG -130/25)    EDUCATION / INSTRUCTIONS: BG monitoring instructions: Patient is instructed to check her blood sugars 3 times a  day, before meals. Call [facility] if: BG persistently < 70  I reviewed the Rule of 15 for the treatment of hypoglycemia in detail with the patient. Literature supplied.   2) Diabetic complications:  Eye: Does  have known diabetic retinopathy.  Neuro/ Feet: Does not have known diabetic peripheral neuropathy .  Renal: Patient does not have known baseline CKD. She   is on an ACEI/ARB at present.     3) Dyslipidemia: Patient is on Rosuvastatin 5 mg daily . LDL at  goal.We had discussed cardiovascular benefits of statins in the past.    F/U in 6 months   Signed electronically by: Mack Guise, MD  [facility]  [facility] [address] [place], [place] [address] Phone: [phone] FAX: [phone]   CC: Leeroy Cha, MD [address] [place] [address] Phone: [phone]  Fax: [phone]  Return to Endocrinology clinic as below: Future Appointments  Date Time Provider [place]  01/20/2021 10:30 AM Gatha Mayer, MD [facility]  03/03/2021  1:00 PM [name], MD [facility]

## 2021-01-14 ENCOUNTER — Other Ambulatory Visit: Payer: Self-pay

## 2021-01-14 ENCOUNTER — Ambulatory Visit: Payer: Self-pay | Admitting: Physical Therapy

## 2021-01-14 ENCOUNTER — Encounter (HOSPITAL_BASED_OUTPATIENT_CLINIC_OR_DEPARTMENT_OTHER): Payer: Self-pay | Admitting: Obstetrics and Gynecology

## 2021-01-14 NOTE — Progress Notes (Signed)
Spoke w/ via phone for pre-op interview--- [employer]----     ISTAT, EKG          Lab results------ COVID test -----patient states asymptomatic no test needed Arrive at ------- 5784 NPO after MN NO Solid Food.  Clear liquids from MN until--- 0915 Med rec completed Medications to take morning of surgery ----- NONE Diabetic medication ----- 1/2 Insulin dose night before surgery. No PO DM meds AM of surgery. Patient instructed no nail polish to be worn day of surgery Patient instructed to bring photo id and insurance card day of surgery Patient aware to have Driver (ride ) / caregiver    for 24 hours after surgery  Patient Special Instructions ----- Pre-Op special Istructions ----- Patient verbalized understanding of instructions that were given at this phone interview. Patient denies shortness of breath, chest pain, fever, cough at this phone interview. Anesthesia Review:  PCP: Dr. Fara Olden Cardiologist : [facility] Chest x-ray : EKG : Echo :2016- 55-60% Stress test: Cardiac Cath :  Activity level:  Sleep Study/ CPAP : Fasting Blood Sugar :  130    / Checks Blood Sugar 3-4 times a day:   Blood Thinner/ Instructions /Last Dose: ASA / Instructions/ Last Dose :   Instructed didn't need to stop ASA pre op.

## 2021-01-14 NOTE — H&P (Signed)
[facility] Pre-Operative H&P  Subjective Chief Complaint: Kamaiya Antilla presents for a preoperative encounter.   History of Present Illness: Kristine Callahan is a 55 y.o. female who presents for preoperative visit.  She is scheduled to undergo posterior repair, perineorrhaphy, possible sacrospinous ligament fixation on 01/21/21.  Her symptoms include vaginal bulge, and she was was found to have Stage I anterior, Stage II posterior, Stage I apical prolapse.  Urodynamics showed: 1. Sensation was normal; capacity was normal 2. Stress Incontinence was not demonstrated; 3. Detrusor Overactivity was not demonstrated. 4. Emptying was normal with a normal PVR, a sustained detrusor contraction present,  abdominal straining not present, normal urethral sphincter activity on EMG.  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   Family History  Adopted: Yes    Social History   Tobacco Use   Smoking status: Never   Smokeless tobacco: Never  Vaping Use   Vaping Use: Never used  Substance Use Topics   Alcohol use: Yes    Comment: very rare   Drug use: No     Review of Systems was negative for a full 10 system review except as noted in the History of Present Illness.  No current facility-administered medications for this encounter.  Current Outpatient Medications:     aspirin 81 MG tablet, Take 81 mg by mouth daily., Disp: , Rfl:    Blood Glucose Monitoring Suppl (ACCU-CHEK GUIDE ME) w/Device KIT, by Does not apply route., Disp: , Rfl:    Continuous Blood Gluc Sensor (DEXCOM G6 SENSOR) MISC, 1 Device by Does not apply route as directed., Disp: 9 each, Rfl: 3   Continuous Blood Gluc Transmit (DEXCOM G6 TRANSMITTER) MISC, 1 Device by Does not apply route as directed., Disp: 1 each, Rfl: 3   dapagliflozin propanediol (FARXIGA) 10 MG TABS tablet, Take 1 tablet (10 mg total) by mouth daily., Disp: 90 tablet, Rfl: 3   fenofibrate 160 MG tablet, Take 160 mg by mouth daily., Disp: , Rfl:    glucose blood (ACCU-CHEK GUIDE) test strip, 1 each by Other route as needed for other. Use as instructed, Disp: , Rfl:    hydrocortisone (ANUSOL-HC) 2.5 % rectal cream, Place 1 application rectally 2 (two) times daily., Disp: 30 g, Rfl: 1   insulin aspart (NOVOLOG FLEXPEN) 100 UNIT/ML FlexPen, Max daily 60 units, Disp: 60 mL, Rfl: 1   insulin degludec (TRESIBA FLEXTOUCH) 100 UNIT/ML FlexTouch Pen, Inject 30 Units into the skin daily., Disp: 30 mL, Rfl: 3   Insulin Pen Needle 32G X 4 MM MISC, 1 Device by Does not apply route in the morning, at noon, in the evening, and at bedtime., Disp: 400 each, Rfl: 2   liraglutide (VICTOZA) 18 MG/3ML SOPN, Inject 1.8 mg into the skin daily., Disp: 27 mL, Rfl: 3   lisinopril-hydrochlorothiazide (  ZESTORETIC) 20-25 MG tablet, Take 1 tablet by mouth daily., Disp: , Rfl:    mesalamine (CANASA) 1000 MG suppository, Place 1 suppository (1,000 mg total) rectally at bedtime., Disp: 30 suppository, Rfl: 12   metFORMIN (GLUCOPHAGE) 1000 MG tablet, Take 1 tablet (1,000 mg total) by mouth 2 (two) times daily with a meal., Disp: 180 tablet, Rfl: 3   Nystatin (GERHARDT'S BUTT CREAM) CREA, Apply 2-4 x a day to perianal rash, Disp: 1 each, Rfl: 3   nystatin-triamcinolone ointment (MYCOLOG), Apply 1 application topically 2 (two) times daily., Disp: 60 g, Rfl: 1    rosuvastatin (CRESTOR) 5 MG tablet, Take 5 mg by mouth daily., Disp: , Rfl:    acetaminophen (TYLENOL) 500 MG tablet, Take 1 tablet (500 mg total) by mouth every 6 (six) hours as needed (pain)., Disp: 30 tablet, Rfl: 0   docusate sodium (COLACE) 100 MG capsule, Take 1 capsule (100 mg total) by mouth 2 (two) times daily., Disp: 60 capsule, Rfl: 0   ibuprofen (ADVIL) 600 MG tablet, Take 1 tablet (600 mg total) by mouth every 6 (six) hours as needed., Disp: 30 tablet, Rfl: 0   oxyCODONE (OXY IR/ROXICODONE) 5 MG immediate release tablet, Take 1 tablet (5 mg total) by mouth every 4 (four) hours as needed for severe pain., Disp: 5 tablet, Rfl: 0   polyethylene glycol powder (GLYCOLAX/MIRALAX) 17 GM/SCOOP powder, Take 17 g by mouth daily. Drink 17g (1 scoop) dissolved in water per day., Disp: 255 g, Rfl: 0   Objective There were no vitals filed for this visit.  There is no height or weight on file to calculate BMI. Gen: AAO x3  Previous Pelvic Exam showed: POP-Q (07/14/20):    POP-Q   -2                                            Aa   -2                                           Ba   -5.5                                              C    4.5                                            Gh   4                                            Pb   10                                            tvl    0  Ap   0                                            Bp   -8                                              D         Assessment/ Plan  The patient is a 54 y.o. year old with stage II POP, scheduled to undergo posterior repair, perineorrhaphy, possible sacrospinous ligament fixation.     [name], MD

## 2021-01-20 ENCOUNTER — Ambulatory Visit (INDEPENDENT_AMBULATORY_CARE_PROVIDER_SITE_OTHER): Payer: BC Managed Care – PPO | Admitting: Internal Medicine

## 2021-01-20 ENCOUNTER — Encounter: Payer: Self-pay | Admitting: Internal Medicine

## 2021-01-20 VITALS — BP 126/72 | HR 64 | Ht 70.0 in | Wt 228.2 lb

## 2021-01-20 DIAGNOSIS — K51218 Ulcerative (chronic) proctitis with other complication: Secondary | ICD-10-CM

## 2021-01-20 DIAGNOSIS — N816 Rectocele: Secondary | ICD-10-CM | POA: Diagnosis not present

## 2021-01-20 DIAGNOSIS — L309 Dermatitis, unspecified: Secondary | ICD-10-CM

## 2021-01-20 NOTE — Progress Notes (Signed)
Kristine Callahan 55 y.o. May 22, 1965 [id number]  Assessment & Plan:   Encounter Diagnoses  Name Primary?   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  Question how much that could be due to her posterior vaginal wall prolapse issues.  I am inclined to have her continue her current therapy it is fine for her to use a stool softener, and I am contemplating a follow-up of her proctitis with a sigmoidoscopy in December.  I will ask Dr. Wannetta Sender if that would be acceptable.  Should she need to stop her suppositories temporarily for a few days or a week or so after her surgery I think that is acceptable though I would prefer she stay on therapy to prevent relapse.  CC: Leeroy Cha, MD Dr. Sherlene Shams Subjective:   Chief Complaint: Follow-up of ulcerative proctitis  HPI 55 year old woman with new diagnosis of chronic ulcerative proctitis a colonoscopy in mid August, who had terrible perianal dermatitis and fecal incontinence related to that.  Started on mesalamine suppository nightly and things are much better.  Continues to use Gearheart's Butt cream but the perianal dermatitis is gone she thinks.  Fecal incontinence is almost never and slight.  She has a posterior vaginal wall prolapse and is about to undergo a posterior repair by Dr. Wannetta Sender tomorrow.  Dr. Wannetta Sender wants her to continue to use Metamucil and MiraLAX which she is doing to promote defecation and to add a stool softener.  The patient had COVID in early October and at that time took some Mucinex and thinks that may have caused constipation problems.  She added the MiraLAX to that and she is moving her bowels better but still goes a few days without defecation.  She had a very bad time for several days where she felt bloated and distended and used MiraLAX Dulcolax and an  enema which finally helped.  That is when she started taking MiraLAX every day and a right flank pain she was having previously is now gone.  She is taking a capful of Metamucil as well.  She also reports her hemorrhoids are less swollen since these issues were found and treated.  She has reengaged with diabetes care and her endocrinologist has told her Wilder Glade may cause some constipation.  She has been working with [facility] for pelvic floor physical therapy. Allergies  Allergen Reactions   Latex Other (See Comments)    Taste sensation    Current Meds  Medication Sig   acetaminophen (TYLENOL) 500 MG tablet Take 1 tablet (500 mg total) by mouth every 6 (six) hours as needed (pain).   aspirin 81 MG tablet Take 81 mg by mouth daily.   Blood Glucose Monitoring Suppl (ACCU-CHEK GUIDE ME) w/Device KIT by Does not apply route.   Continuous Blood Gluc Sensor (DEXCOM G6 SENSOR) MISC 1 Device by Does not apply route as directed.   Continuous Blood Gluc Transmit (DEXCOM G6 TRANSMITTER) MISC 1 Device by Does not apply route as directed.   dapagliflozin propanediol (FARXIGA) 10 MG TABS tablet Take 1 tablet (10 mg total) by mouth daily.   docusate sodium (COLACE) 100 MG capsule Take 1 capsule (100 mg total) by mouth 2 (two) times daily.   fenofibrate 160 MG tablet Take 160 mg by mouth daily.   glucose blood (ACCU-CHEK GUIDE) test strip 1 each by Other  route as needed for other. Use as instructed   hydrocortisone (ANUSOL-HC) 2.5 % rectal cream Place 1 application rectally 2 (two) times daily.   ibuprofen (ADVIL) 600 MG tablet Take 1 tablet (600 mg total) by mouth every 6 (six) hours as needed.   insulin aspart (NOVOLOG FLEXPEN) 100 UNIT/ML FlexPen Max daily 60 units   insulin degludec (TRESIBA FLEXTOUCH) 100 UNIT/ML FlexTouch Pen Inject 30 Units into the skin daily.   Insulin Pen Needle 32G X 4 MM MISC 1 Device by Does not apply route in the morning, at noon, in the evening, and at  bedtime.   liraglutide (VICTOZA) 18 MG/3ML SOPN Inject 1.8 mg into the skin daily.   lisinopril-hydrochlorothiazide (ZESTORETIC) 20-25 MG tablet Take 1 tablet by mouth daily.   mesalamine (CANASA) 1000 MG suppository Place 1 suppository (1,000 mg total) rectally at bedtime.   metFORMIN (GLUCOPHAGE) 1000 MG tablet Take 1 tablet (1,000 mg total) by mouth 2 (two) times daily with a meal.   Nystatin (GERHARDT'S BUTT CREAM) CREA Apply 2-4 x a day to perianal rash   nystatin-triamcinolone ointment (MYCOLOG) Apply 1 application topically 2 (two) times daily.   oxyCODONE (OXY IR/ROXICODONE) 5 MG immediate release tablet Take 1 tablet (5 mg total) by mouth every 4 (four) hours as needed for severe pain.   polyethylene glycol powder (GLYCOLAX/MIRALAX) 17 GM/SCOOP powder Take 17 g by mouth daily. Drink 17g (1 scoop) dissolved in water per day.   rosuvastatin (CRESTOR) 5 MG tablet Take 5 mg by mouth daily.   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   Headache    hx migraines none in years   Heart murmur, systolic    Negative echocardiogram 2016   Hypertension    Obesity    Prolapse of 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    Umbilical hernia    Uncontrolled type 2 diabetes mellitus with hyperglycemia, with long-term current use of insulin ([place]) 11/29/2019   Past Surgical History:  Procedure Laterality Date   CHOLECYSTECTOMY     COLONOSCOPY WITH PROPOFOL N/A 05/31/2016   Procedure: COLONOSCOPY WITH PROPOFOL;  Surgeon: Garlan Fair, MD;  Location: [facility];  Service: Endoscopy;  Laterality: N/A;   Social History   Social History Narrative   Patient is married with 3 sons   Never smoker, no alcohol   2 caffeinated beverages daily   No drug use no other tobacco   family history is not on file. She was adopted.   Review of Systems As per HPI  Objective:   Physical Exam BP 126/72   Pulse 64   Ht 5' 10" (1.778 m)   Wt 228 lb 3.2 oz (103.5 kg)   LMP   (LMP Unknown)   BMI 32.74 kg/m  Abd is obese, soft and NT w/ small soft and reducible umbilical hernia  Cherylann Parr CMA present Perianal dermatitis is resolved - anoderm w/ medium external hemorrhoids and some mild erythema - major improvement in findings

## 2021-01-20 NOTE — Patient Instructions (Addendum)
If you are age 55 or younger, your body mass index should be between 19-25. Your Body mass index is 32.74 kg/m. If this is out of the aformentioned range listed, please consider follow up with your Primary Care Provider.  ________________________________________________________  The [facility] providers would like to encourage you to use [facility] to communicate with providers for non-urgent requests or questions.  Due to long hold times on the telephone, sending your provider a message by [facility] may be a faster and more efficient way to get a response.  Please allow 48 business hours for a response.  Please remember that this is for non-urgent requests.  _______________________________________________________  Dr. Carlean Purl will be reaching out to your GYN about doing the sigmoid procedure. Once her hears back we will contact you to follow up with you and let you know what we will be doing.  Follow up pending at this point.  I appreciate the opportunity to care for you. Silvano Rusk, MD, [facility]

## 2021-01-21 ENCOUNTER — Ambulatory Visit (HOSPITAL_BASED_OUTPATIENT_CLINIC_OR_DEPARTMENT_OTHER): Payer: BC Managed Care – PPO | Admitting: Anesthesiology

## 2021-01-21 ENCOUNTER — Encounter (HOSPITAL_BASED_OUTPATIENT_CLINIC_OR_DEPARTMENT_OTHER): Payer: Self-pay | Admitting: Obstetrics and Gynecology

## 2021-01-21 ENCOUNTER — Ambulatory Visit (HOSPITAL_BASED_OUTPATIENT_CLINIC_OR_DEPARTMENT_OTHER)
Admission: RE | Admit: 2021-01-21 | Discharge: 2021-01-21 | Disposition: A | Discharge status: Home / Self Care | Payer: BC Managed Care – PPO | Attending: Obstetrics and Gynecology | Admitting: Obstetrics and Gynecology

## 2021-01-21 ENCOUNTER — Encounter (HOSPITAL_BASED_OUTPATIENT_CLINIC_OR_DEPARTMENT_OTHER)
Admission: RE | Disposition: A | Discharge status: Home / Self Care | Payer: Self-pay | Source: Home / Self Care | Attending: Obstetrics and Gynecology

## 2021-01-21 DIAGNOSIS — Z90710 Acquired absence of both cervix and uterus: Secondary | ICD-10-CM | POA: Insufficient documentation

## 2021-01-21 DIAGNOSIS — E113593 Type 2 diabetes mellitus with proliferative diabetic retinopathy without macular edema, bilateral: Secondary | ICD-10-CM

## 2021-01-21 DIAGNOSIS — E1142 Type 2 diabetes mellitus with diabetic polyneuropathy: Secondary | ICD-10-CM

## 2021-01-21 DIAGNOSIS — Z79899 Other long term (current) drug therapy: Secondary | ICD-10-CM | POA: Insufficient documentation

## 2021-01-21 DIAGNOSIS — E119 Type 2 diabetes mellitus without complications: Secondary | ICD-10-CM | POA: Insufficient documentation

## 2021-01-21 DIAGNOSIS — Z7985 Long-term (current) use of injectable non-insulin antidiabetic drugs: Secondary | ICD-10-CM | POA: Diagnosis not present

## 2021-01-21 DIAGNOSIS — Z91040 Latex allergy status: Secondary | ICD-10-CM | POA: Diagnosis not present

## 2021-01-21 DIAGNOSIS — N816 Rectocele: Secondary | ICD-10-CM | POA: Diagnosis not present

## 2021-01-21 DIAGNOSIS — N993 Prolapse of vaginal vault after hysterectomy: Secondary | ICD-10-CM | POA: Diagnosis present

## 2021-01-21 DIAGNOSIS — Z7984 Long term (current) use of oral hypoglycemic drugs: Secondary | ICD-10-CM | POA: Insufficient documentation

## 2021-01-21 DIAGNOSIS — Z794 Long term (current) use of insulin: Secondary | ICD-10-CM | POA: Diagnosis not present

## 2021-01-21 DIAGNOSIS — Z7982 Long term (current) use of aspirin: Secondary | ICD-10-CM | POA: Diagnosis not present

## 2021-01-21 DIAGNOSIS — I10 Essential (primary) hypertension: Secondary | ICD-10-CM | POA: Insufficient documentation

## 2021-01-21 DIAGNOSIS — E1165 Type 2 diabetes mellitus with hyperglycemia: Secondary | ICD-10-CM

## 2021-01-21 HISTORY — PX: RECTOCELE REPAIR: SHX761

## 2021-01-21 HISTORY — PX: ANTERIOR AND POSTERIOR REPAIR WITH SACROSPINOUS FIXATION: SHX6536

## 2021-01-21 LAB — GLUCOSE, CAPILLARY: Glucose-Capillary: 132 mg/dL — ABNORMAL HIGH (ref 70–99)

## 2021-01-21 LAB — BASIC METABOLIC PANEL
Anion gap: 4 — ABNORMAL LOW (ref 5–15)
BUN: 19 mg/dL (ref 6–20)
CO2: 24 mmol/L (ref 22–32)
Calcium: 9 mg/dL (ref 8.9–10.3)
Chloride: 107 mmol/L (ref 98–111)
Creatinine, Ser: 0.67 mg/dL (ref 0.44–1.00)
GFR, Estimated: >60 mL/min (ref >60)
Glucose, Bld: 150 mg/dL — ABNORMAL HIGH (ref 70–99)
Potassium: 4.6 mmol/L (ref 3.5–5.1)
Sodium: 135 mmol/L (ref 135–145)

## 2021-01-21 SURGERY — COLPORRHAPHY, POSTERIOR, FOR RECTOCELE REPAIR
Anesthesia: General | Site: Vagina

## 2021-01-21 MED ORDER — 0.9 % SODIUM CHLORIDE (POUR BTL) OPTIME
TOPICAL | Status: DC | PRN
  Administered 2021-01-21: 500 mL

## 2021-01-21 MED ORDER — ACETAMINOPHEN 500 MG PO TABS
1000.0000 mg | ORAL_TABLET | Freq: Once | ORAL | Status: AC
  Administered 2021-01-21: 1000 mg via ORAL

## 2021-01-21 MED ORDER — MIDAZOLAM HCL 2 MG/2ML IJ SOLN
INTRAMUSCULAR | Status: DC | PRN
  Administered 2021-01-21: 2 mg via INTRAVENOUS

## 2021-01-21 MED ORDER — POVIDONE-IODINE 10 % EX SWAB: 2.0000 "application " | Freq: Once | CUTANEOUS | Status: DC

## 2021-01-21 MED ORDER — OXYCODONE HCL 5 MG/5ML PO SOLN: 5.0000 mg | Freq: Once | ORAL | Status: AC | PRN

## 2021-01-21 MED ORDER — PROPOFOL 10 MG/ML IV BOLUS
INTRAVENOUS | Status: DC | PRN
  Administered 2021-01-21: 160 mg via INTRAVENOUS

## 2021-01-21 MED ORDER — ONDANSETRON HCL 4 MG/2ML IJ SOLN
INTRAMUSCULAR | Status: DC | PRN
  Administered 2021-01-21: 4 mg via INTRAVENOUS

## 2021-01-21 MED ORDER — OXYCODONE HCL 5 MG PO TABS
5.0000 mg | ORAL_TABLET | Freq: Once | ORAL | Status: AC | PRN
  Administered 2021-01-21: 5 mg via ORAL

## 2021-01-21 MED ORDER — ACETAMINOPHEN 500 MG PO TABS
ORAL_TABLET | ORAL | Status: AC
  Filled 2021-01-21: qty 2

## 2021-01-21 MED ORDER — EPHEDRINE 5 MG/ML INJ
INTRAVENOUS | Status: AC
  Filled 2021-01-21: qty 5

## 2021-01-21 MED ORDER — FENTANYL CITRATE (PF) 100 MCG/2ML IJ SOLN: 25.0000 ug | INTRAMUSCULAR | Status: DC | PRN

## 2021-01-21 MED ORDER — LIDOCAINE 2% (20 MG/ML) 5 ML SYRINGE
INTRAMUSCULAR | Status: AC
  Filled 2021-01-21: qty 5

## 2021-01-21 MED ORDER — ONDANSETRON HCL 4 MG/2ML IJ SOLN
INTRAMUSCULAR | Status: AC
  Filled 2021-01-21: qty 2

## 2021-01-21 MED ORDER — CEFAZOLIN SODIUM-DEXTROSE 2-4 GM/100ML-% IV SOLN
2.0000 g | INTRAVENOUS | Status: AC
  Administered 2021-01-21: 2 g via INTRAVENOUS

## 2021-01-21 MED ORDER — PROMETHAZINE HCL 25 MG/ML IJ SOLN: 6.2500 mg | INTRAMUSCULAR | Status: DC | PRN

## 2021-01-21 MED ORDER — AMISULPRIDE (ANTIEMETIC) 5 MG/2ML IV SOLN: 10.0000 mg | Freq: Once | INTRAVENOUS | Status: DC | PRN

## 2021-01-21 MED ORDER — LIDOCAINE 2% (20 MG/ML) 5 ML SYRINGE
INTRAMUSCULAR | Status: DC | PRN
  Administered 2021-01-21: 80 mg via INTRAVENOUS

## 2021-01-21 MED ORDER — PROPOFOL 10 MG/ML IV BOLUS
INTRAVENOUS | Status: AC
  Filled 2021-01-21: qty 20

## 2021-01-21 MED ORDER — MIDAZOLAM HCL 2 MG/2ML IJ SOLN
INTRAMUSCULAR | Status: AC
  Filled 2021-01-21: qty 2

## 2021-01-21 MED ORDER — KETOROLAC TROMETHAMINE 30 MG/ML IJ SOLN
INTRAMUSCULAR | Status: DC | PRN
  Administered 2021-01-21: 30 mg via INTRAVENOUS

## 2021-01-21 MED ORDER — KETOROLAC TROMETHAMINE 30 MG/ML IJ SOLN
INTRAMUSCULAR | Status: AC
  Filled 2021-01-21: qty 1

## 2021-01-21 MED ORDER — OXYCODONE HCL 5 MG PO TABS
ORAL_TABLET | ORAL | Status: AC
  Filled 2021-01-21: qty 1

## 2021-01-21 MED ORDER — SURGIFLO WITH THROMBIN (HEMOSTATIC MATRIX KIT) OPTIME
TOPICAL | Status: DC | PRN
  Administered 2021-01-21: 1

## 2021-01-21 MED ORDER — WHITE PETROLATUM EX OINT
TOPICAL_OINTMENT | CUTANEOUS | Status: AC
  Filled 2021-01-21: qty 5

## 2021-01-21 MED ORDER — LIDOCAINE-EPINEPHRINE 1 %-1:100000 IJ SOLN
INTRAMUSCULAR | Status: DC | PRN
  Administered 2021-01-21: 10 mL
  Administered 2021-01-21: 5 mL

## 2021-01-21 MED ORDER — FENTANYL CITRATE (PF) 100 MCG/2ML IJ SOLN
INTRAMUSCULAR | Status: AC
  Filled 2021-01-21: qty 2

## 2021-01-21 MED ORDER — EPHEDRINE SULFATE-NACL 50-0.9 MG/10ML-% IV SOSY
PREFILLED_SYRINGE | INTRAVENOUS | Status: DC | PRN
  Administered 2021-01-21: 15 mg via INTRAVENOUS
  Administered 2021-01-21: 10 mg via INTRAVENOUS

## 2021-01-21 MED ORDER — FENTANYL CITRATE (PF) 100 MCG/2ML IJ SOLN
INTRAMUSCULAR | Status: DC | PRN
  Administered 2021-01-21: 50 ug via INTRAVENOUS
  Administered 2021-01-21 (×2): 25 ug via INTRAVENOUS
  Administered 2021-01-21: 50 ug via INTRAVENOUS
  Administered 2021-01-21 (×2): 25 ug via INTRAVENOUS

## 2021-01-21 MED ORDER — CEFAZOLIN SODIUM-DEXTROSE 2-4 GM/100ML-% IV SOLN
INTRAVENOUS | Status: AC
  Filled 2021-01-21: qty 100

## 2021-01-21 MED ORDER — LACTATED RINGERS IV SOLN: INTRAVENOUS | Status: DC

## 2021-01-21 SURGICAL SUPPLY — 30 items
AGENT HMST KT MTR STRL THRMB (HEMOSTASIS) ×2
BLADE SURG 15 STRL LF DISP TIS (BLADE) ×2 IMPLANT
BLADE SURG 15 STRL SS (BLADE) ×3
Capio Violet Monofilament Polydioxanone Suture ×6 IMPLANT
DEVICE CAPIO SLIM SINGLE (INSTRUMENTS) ×3 IMPLANT
GAUZE 4X4 16PLY ~~LOC~~+RFID DBL (SPONGE) ×6 IMPLANT
GLOVE SURG NEOP MICRO LF SZ6.5 (GLOVE) ×3 IMPLANT
GLOVE SURG POLYISO LF SZ6 (GLOVE) ×9 IMPLANT
GLOVE SURG UNDER POLY LF SZ6.5 (GLOVE) ×6 IMPLANT
GLOVE SURG UNDER POLY LF SZ7 (GLOVE) ×3 IMPLANT
GOWN STRL REUS W/ TWL LRG LVL3 (GOWN DISPOSABLE) ×2 IMPLANT
GOWN STRL REUS W/TWL LRG LVL3 (GOWN DISPOSABLE) ×6 IMPLANT
HIBICLENS CHG 4% 4OZ (MISCELLANEOUS) ×3 IMPLANT
KIT TURNOVER CYSTO (KITS) ×3 IMPLANT
MANIFOLD NEPTUNE II (INSTRUMENTS) ×3 IMPLANT
NEEDLE HYPO 22GX1.5 SAFETY (NEEDLE) ×3 IMPLANT
NEEDLE MAYO 6 CRC TAPER PT (NEEDLE) ×3 IMPLANT
NS IRRIG 500ML POUR BTL (IV SOLUTION) ×3 IMPLANT
PACK PERINEAL COLD (PAD) ×3 IMPLANT
PACK VAGINAL WOMENS (CUSTOM PROCEDURE TRAY) ×3 IMPLANT
RETRACTOR LONE STAR DISPOSABLE (INSTRUMENTS) ×3 IMPLANT
RETRACTOR STAY HOOK 5MM (MISCELLANEOUS) ×15 IMPLANT
SURGIFLO W/THROMBIN 8M KIT (HEMOSTASIS) ×3 IMPLANT
SUT ABS MONO DBL WITH NDL 48IN (SUTURE) ×6 IMPLANT
SUT VIC AB 0 CT1 27 (SUTURE) ×3
SUT VIC AB 0 CT1 27XBRD ANTBC (SUTURE) ×2 IMPLANT
SUT VICRYL 2-0 SH 8X27 (SUTURE) ×6 IMPLANT
SYR BULB EAR ULCER 3OZ GRN STR (SYRINGE) ×3 IMPLANT
TOWEL OR 17X26 10 PK STRL BLUE (TOWEL DISPOSABLE) ×3 IMPLANT
TRAY FOLEY W/BAG SLVR 14FR LF (SET/KITS/TRAYS/PACK) ×3 IMPLANT

## 2021-01-21 NOTE — Op Note (Signed)
Operative Note  Preoperative Diagnosis: posterior vaginal prolapse and vaginal vault prolapse after hysterectomy  Postoperative Diagnosis: same  Procedures performed:  Sacrospinous ligament fixation, posterior repair and perineorrhaphy  Implants: none  Attending Surgeon: Sherlene Shams, MD   Anesthesia: General endotracheal  Findings: Stage III rectocele on vaginal exam   Specimens: none  Estimated blood loss: 200 mL  IV fluids: 700 mL  Urine output: 237 mL  Complications: none  Procedure in Detail:  After informed consent was obtained, the patient was taken to the operating room where anesthesia was induced and found to be adequate. She was placed in dorsal lithotomy position, taking care to avoid any traction on the extremities, and then prepped and draped in the usual sterile fashion. A self-retaining lonestar retractor was placed using four elastic blue stays.  After a foley catheter was inserted into the urethra.  Vaginal exam concluded that an apical procedure was also needed. Two Allis clamps were along the posterior vaginal wall defect. 1% lidocaine with epinephrine was injected into the vaginal mucosa.  A vertical incision was made between these two Allis clamps with a 15 blade scalpel.  Allis clamps were placed along this incision and Metzenbaum scissors were used to undermine the vaginal mucosa along the incision.  The vaginal mucosa was then sharply dissected off to the septum bilaterally.    For the sacrospinous ligament fixation (SSLF), the ischial spine was accessed on the right side via dissection with scissors and blunt dissection.  The sacrospinous ligament was palpated. Two 0 PDS suture was then placed at the sacrospinous ligament two fingerbreadths medial to the ischial spine, in order to avoid the pudendal neurovascular bundle, using a Capio needle driver.  The PDS suture was attached to the vaginal epithelium on the ipsilateral side of the vaginal apex and  held. Posterior plication of the rectovaginal septum was then performed using plicating sutures of 2-0 Vicryl in three layers. The last distal stitch incorporated the perineal body in a U stitch fashion. The vaginal mucosal edges were trimmed and the incision reapproximated with 2-0 Vicryl in a running fashion. The SSLF suture was then tied down with excellent support of the posterior and apical vagina.   Two allis clamps were placed at the perineum. After injection with local anesthetic, an inverted triangle shape was made over the perineum. The perineal skin was removed. The underlying tissue was undermined with metzenbaum scissors. An 0-vicryl suture was used to reapproximate the perineal body. A 2-0 vicryl suture was then used to close the perineal skin in a subcutaneous and subcuticular fashion.    The Foley catheter was removed.  The vagina was copiously irrigated.  Hemostasis was noted.  Vaginal packing was not placed.  A rectal examination was normal and confirmed no sutures within the rectum. The patient tolerated the procedure well.  She was awakened from anesthesia and transferred to the recovery room in stable condition. All counts were correct x 2.     [name], MD

## 2021-01-21 NOTE — Anesthesia Preprocedure Evaluation (Addendum)
Anesthesia Evaluation  Patient identified by MRN, date of birth, ID band Patient awake    Reviewed: Allergy & Precautions, NPO status , Patient's Chart, lab work & pertinent test results  Airway Mallampati: II  TM Distance: >3 FB Neck ROM: Full    Dental no notable dental hx.    Pulmonary neg pulmonary ROS,    Pulmonary exam normal breath sounds clear to auscultation       Cardiovascular Exercise Tolerance: Good hypertension, Normal cardiovascular exam Rhythm:Regular Rate:Normal     Neuro/Psych  Headaches,  Neuromuscular disease negative psych ROS   GI/Hepatic Chronic proctitis   Endo/Other  diabetes, Poorly Controlled, Type 2obesity  Renal/GU   negative genitourinary   Musculoskeletal negative musculoskeletal ROS (+)   Abdominal Normal abdominal exam  (+)   Peds negative pediatric ROS (+)  Hematology negative hematology ROS (+)   Anesthesia Other Findings   Reproductive/Obstetrics negative OB ROS                            Anesthesia Physical Anesthesia Plan  ASA: 3  Anesthesia Plan: General   Post-op Pain Management:    Induction: Intravenous  PONV Risk Score and Plan: 3 and Treatment may vary due to age or medical condition, Midazolam, Ondansetron and Dexamethasone  Airway Management Planned: LMA  Additional Equipment: None  Intra-op Plan:   Post-operative Plan: Extubation in OR  Informed Consent: I have reviewed the patients History and Physical, chart, labs and discussed the procedure including the risks, benefits and alternatives for the proposed anesthesia with the patient or authorized representative who has indicated his/her understanding and acceptance.     Dental advisory given  Plan Discussed with: CRNA and Anesthesiologist  Anesthesia Plan Comments:         Anesthesia Quick Evaluation

## 2021-01-21 NOTE — Anesthesia Procedure Notes (Signed)
Procedure Name: LMA Insertion Date/Time: 01/21/2021 12:37 PM Performed by: Suan Halter, CRNA Pre-anesthesia Checklist: Patient identified, Emergency Drugs available, Suction available and Patient being monitored Patient Re-evaluated:Patient Re-evaluated prior to induction Oxygen Delivery Method: Circle system utilized Preoxygenation: Pre-oxygenation with 100% oxygen Induction Type: IV induction Ventilation: Mask ventilation without difficulty LMA: LMA inserted LMA Size: 4.0 Number of attempts: 1 Airway Equipment and Method: Bite block Placement Confirmation: positive ETCO2 Tube secured with: Tape Dental Injury: Teeth and Oropharynx as per pre-operative assessment

## 2021-01-21 NOTE — Anesthesia Postprocedure Evaluation (Signed)
Anesthesia Post Note  Patient: Karma Ansley  Procedure(s) Performed: POSTERIOR REPAIR (RECTOCELE) and perineorrhaphy (Vagina ) SACROSPINOUS FIXATION     Patient location during evaluation: PACU Anesthesia Type: General Level of consciousness: awake and alert Pain management: pain level controlled Vital Signs Assessment: post-procedure vital signs reviewed and stable Respiratory status: spontaneous breathing, nonlabored ventilation and respiratory function stable Cardiovascular status: blood pressure returned to baseline and stable Postop Assessment: no apparent nausea or vomiting Anesthetic complications: no   No notable events documented.  Last Vitals:  Vitals:   01/21/21 1430 01/21/21 1520  BP: (!) 158/82   Pulse: 76   Resp: 11   Temp: 36.7 C (!) 36 C  SpO2: 96%     Last Pain:  Vitals:   01/21/21 1400  TempSrc:   PainSc: 0-No pain                 Merlinda Frederick

## 2021-01-21 NOTE — Transfer of Care (Signed)
Immediate Anesthesia Transfer of Care Note  Patient: Kristine Callahan  Procedure(s) Performed: POSTERIOR REPAIR (RECTOCELE) and perineorrhaphy (Vagina ) SACROSPINOUS FIXATION  Patient Location: PACU  Anesthesia Type:General  Level of Consciousness: sedated  Airway & Oxygen Therapy: Patient Spontanous Breathing and Patient connected to nasal cannula oxygen  Post-op Assessment: Report given to RN and Post -op Vital signs reviewed and stable  Post vital signs: Reviewed and stable  Last Vitals:  Vitals Value Taken Time  BP 158/82 01/21/21 1430  Temp 36 C 01/21/21 1520  Pulse 86 01/21/21 1434  Resp 10 01/21/21 1434  SpO2 91 % 01/21/21 1434  Vitals shown include unvalidated device data.  Last Pain:  Vitals:   01/21/21 1400  TempSrc:   PainSc: 0-No pain      Patients Stated Pain Goal: 5 (76/14/70 9295)  Complications: No notable events documented.

## 2021-01-21 NOTE — Progress Notes (Signed)
Dr. Wannetta Sender notified of patients inability to urinate. Patient was bladder scanned for a total of 84. Patient had been given PO fluids and started on 2nd bag of IV fluids. Dr. Wannetta Sender did not provide any new verbal orders, but just wanted patient to have more time to urinate on her own. Patient voided 137mL at 1614.

## 2021-01-21 NOTE — Discharge Instructions (Addendum)

## 2021-01-21 NOTE — Interval H&P Note (Signed)
History and Physical Interval Note:  01/21/2021 11:47 AM  Kristine Callahan  has presented today for surgery, with the diagnosis of posterior vaginal prolapse.  The various methods of treatment have been discussed with the patient and family. After consideration of risks, benefits and other options for treatment, the patient has consented to  Procedure(s) with comments: POSTERIOR REPAIR (RECTOCELE) and perineorrhaphy (N/A) possible SACROSPINOUS FIXATION (N/A) as a surgical intervention.    Vitals:   01/21/21 1100  BP: (!) 148/81  Pulse: 77  Resp: 16  Temp: 98.5 F (36.9 C)  SpO2: 97%    Gen: NAD CV: S1 S2 RRR Lungs: normal respirations Abd: soft, nontender  The patient's history has been reviewed, patient examined, no change in status, stable for surgery.  I have reviewed the patient's chart and labs.  Questions were answered to the patient's satisfaction.     [name]

## 2021-01-22 ENCOUNTER — Encounter (HOSPITAL_BASED_OUTPATIENT_CLINIC_OR_DEPARTMENT_OTHER): Payer: Self-pay | Admitting: Obstetrics and Gynecology

## 2021-01-22 ENCOUNTER — Telehealth: Payer: Self-pay | Admitting: Obstetrics and Gynecology

## 2021-01-22 NOTE — Telephone Encounter (Signed)
Kristine Callahan underwent posterior repair, perineorrhaphy and sacrospinous ligament fixation on 01/21/21.   She did not have a formal voiding trial but was able to void after the procedure.  She was discharged without a catheter. Please call her for a routine post op check . Thanks!  [name], MD

## 2021-01-22 NOTE — Telephone Encounter (Signed)
Post- Op Call  Kristine Callahan underwent  posterior repair, perineorrhaphy and sacrospinous ligament fixation on 01/21/2021 with Dr Wannetta Sender. The patient reports that her pain is controlled. She is taking Tylenol and 1 oxycodone. She reports vaginal minimal bleeding. She has not had a bowel movement and is taking Miralax and colace for a bowel regimen. She was discharged without a catheter .  Elita Quick, CMA

## 2021-01-26 ENCOUNTER — Encounter (HOSPITAL_BASED_OUTPATIENT_CLINIC_OR_DEPARTMENT_OTHER): Payer: Self-pay | Admitting: *Deleted

## 2021-02-19 ENCOUNTER — Telehealth: Payer: Self-pay

## 2021-02-19 NOTE — Telephone Encounter (Signed)
-----   Message from Gatha Mayer, MD sent at 02/18/2021 12:29 PM EST ----- Regarding: Needs a sigmoidoscopy Remo Lipps,  Please reach out to this lady Ms. Minnis and ask her to set up a flex sig for January.  She only needs to have a single enema for her prep.  Thanks   ----- Message ----- From: [name], MD Sent: 01/20/2021   2:34 PM EST To: Gatha Mayer, MD Subject: RE: Timing of sigmoidoscopy                    I would wait 6 weeks after the surgery.   Thanks for reaching out.   Sherlene Shams ----- Message ----- From: Gatha Mayer, MD Sent: 01/20/2021  11:17 AM EDT To: [name], MD Subject: Timing of sigmoidoscopy                        Delight Stare,   Ms. Terwilliger is having a posterior repair surgery with you tomorrow.  At what point would it be reasonable to perform a sigmoidoscopy again?  I would anticipate a 1 enema prep.  I was considering the middle of December.  Thank you.  Glendell Docker

## 2021-02-19 NOTE — Telephone Encounter (Signed)
Pt was scheduled for a previsit appointment on 03/02/2021 @ 10:30. Pt made aware. Pt was scheduled for an Flex Sig on 03/25/2021 @ 2:30 in the [facility] with Dr. Carlean Purl. Pt made aware.  Pt verbalized understanding with all questions answered

## 2021-02-19 NOTE — Telephone Encounter (Signed)
-----   Message from Gatha Mayer, MD sent at 02/18/2021 12:29 PM EST ----- Regarding: Needs a sigmoidoscopy Kristine Callahan,  Please reach out to this lady Kristine Callahan and ask her to set up a flex sig for January.  She only needs to have a single enema for her prep.  Thanks   ----- Message ----- From: [name], MD Sent: 01/20/2021   2:34 PM EST To: Gatha Mayer, MD Subject: RE: Timing of sigmoidoscopy                    I would wait 6 weeks after the surgery.   Thanks for reaching out.   Kristine Callahan ----- Message ----- From: Gatha Mayer, MD Sent: 01/20/2021  11:17 AM EDT To: [name], MD Subject: Timing of sigmoidoscopy                        Kristine Callahan,   Kristine Callahan is having a posterior repair surgery with you tomorrow.  At what point would it be reasonable to perform a sigmoidoscopy again?  I would anticipate a 1 enema prep.  I was considering the middle of December.  Thank you.  Kristine Callahan

## 2021-02-22 ENCOUNTER — Other Ambulatory Visit: Payer: Self-pay | Admitting: Internal Medicine

## 2021-02-22 DIAGNOSIS — Z1231 Encounter for screening mammogram for malignant neoplasm of breast: Secondary | ICD-10-CM

## 2021-03-01 ENCOUNTER — Telehealth: Payer: Self-pay | Admitting: Internal Medicine

## 2021-03-01 MED ORDER — ACCU-CHEK GUIDE VI STRP: ORAL_STRIP | 3 refills | Status: DC

## 2021-03-01 NOTE — Telephone Encounter (Signed)
Pt called and needs adjustment on the number of BS checks per day. The prescription does not outline how often PT BS should be taken. glucose blood (ACCU-CHEK GUIDE) test strip Pt is current out and needs refill

## 2021-03-01 NOTE — Telephone Encounter (Signed)
Script has been updated and sent

## 2021-03-02 ENCOUNTER — Other Ambulatory Visit: Payer: Self-pay

## 2021-03-02 ENCOUNTER — Ambulatory Visit (AMBULATORY_SURGERY_CENTER): Payer: Self-pay

## 2021-03-02 VITALS — Ht 70.0 in | Wt 229.0 lb

## 2021-03-02 DIAGNOSIS — K6289 Other specified diseases of anus and rectum: Secondary | ICD-10-CM

## 2021-03-02 NOTE — Progress Notes (Signed)
No egg or soy allergy known to patient  No issues known to pt with past sedation with any surgeries or procedures Patient denies ever being told they had issues or difficulty with intubation  No FH of Malignant Hyperthermia Pt is not on diet pills Pt is not on home 02  Pt is not on blood thinners  Pt reports issues with constipation - takes medications to assist with constipation- advised to increase po fluids, activity No A fib or A flutter Pt is fully vaccinated for Covid  Discussed with pt there will be an out-of-pocket cost for prep and that varies from $0 to 70 +  dollars - pt verbalized understanding  Due to the COVID-19 pandemic we are asking patients to follow certain guidelines in [facility] and the [place]   Pt aware of COVID protocols and [facility] guidelines

## 2021-03-02 NOTE — Progress Notes (Signed)
[facility]  Date of Visit: 03/03/2021  History of Present Illness: Kristine Callahan is a 55 y.o. female scheduled today for a post-operative visit.   Surgery: s/p posterior repair, perineorrhaphy and sacrospinous ligament fixation on 01/21/21   She did not have a voiding trial.  Postoperative course has been uncomplicated.   Today she reports she has had no symptoms.  UTI in the last 6 weeks? No  Pain? No  She has returned to her normal activity (except for postop restrictions) Vaginal bulge? No  Stress incontinence: No  Urgency/frequency: No  Urge incontinence: Yes - about two weeks after the surgery, this increased and it has slowly been getting better.  Voiding dysfunction: No  Bowel issues: Yes - still taking miralax once a day, was taking colace but ran out. Has BM 2-3 times in a day then goes 2-3 days without BM. With the colace, was going every day.   Subjective Success: Do you usually have a bulge or something falling out that you can see or feel in the vaginal area? No  Retreatment Success: Any retreatment with surgery or pessary for any compartment? No    Medications: She has a current medication list which includes the following prescription(s): acetaminophen, aspirin, accu-chek guide me, dexcom g6 sensor, dexcom g6 transmitter, dapagliflozin propanediol, docusate sodium, fenofibrate, accu-chek guide, ibuprofen, novolog flexpen, tresiba flextouch, insulin pen needle, victoza, lisinopril-hydrochlorothiazide, melatonin, mesalamine, metformin, gerhardt's butt cream, nystatin-triamcinolone ointment, polyethylene glycol powder, and rosuvastatin.   Allergies: Patient is allergic to latex.   Physical Exam: BP (!) 157/70    Pulse 75    Wt 226 lb (102.5 kg)    LMP  (LMP Unknown)    BMI 32.43 kg/m    Pelvic Examination: Vagina: Incisions healing well. Sutures are present at incision line and and there is not granulation tissue. No tenderness along the anterior or  posterior vagina. No apical tenderness. No pelvic masses.   POP-Q: POP-Q  -1.5                                            Aa   -1.5                                           Ba  -6.5                                              C   3.5                                            Gh  4.5                                            Pb  9  tvl   -3                                            Ap  -3                                            Bp  -7                                              D    ---------------------------------------------------------  Assessment and Plan:  1. Post-operative state   2. Urinary urgency     - She has access to her operative report through MyChart - Can resume regular activity including exercise and intercourse,  if desired.  - Discussed avoidance of heavy lifting and straining long term to reduce the risk of recurrence. Recommended restarting colace along with miralax to avoid straining.  - Will have her follow up in 2 months to reassess urge incontinence. Will consider starting medication at that time if symptoms are persistent.    [name], MD

## 2021-03-03 ENCOUNTER — Ambulatory Visit (INDEPENDENT_AMBULATORY_CARE_PROVIDER_SITE_OTHER): Payer: BC Managed Care – PPO | Admitting: Obstetrics and Gynecology

## 2021-03-03 ENCOUNTER — Encounter: Payer: Self-pay | Admitting: Obstetrics and Gynecology

## 2021-03-03 VITALS — BP 157/70 | HR 75 | Wt 226.0 lb

## 2021-03-03 DIAGNOSIS — Z9889 Other specified postprocedural states: Secondary | ICD-10-CM

## 2021-03-03 DIAGNOSIS — R3915 Urgency of urination: Secondary | ICD-10-CM

## 2021-03-23 ENCOUNTER — Encounter: Payer: Self-pay | Admitting: Internal Medicine

## 2021-03-24 ENCOUNTER — Other Ambulatory Visit: Payer: BC Managed Care – PPO | Admitting: Internal Medicine

## 2021-03-25 ENCOUNTER — Ambulatory Visit (AMBULATORY_SURGERY_CENTER): Payer: BC Managed Care – PPO | Admitting: Internal Medicine

## 2021-03-25 ENCOUNTER — Encounter: Payer: Self-pay | Admitting: Internal Medicine

## 2021-03-25 ENCOUNTER — Other Ambulatory Visit: Payer: Self-pay | Admitting: Internal Medicine

## 2021-03-25 ENCOUNTER — Other Ambulatory Visit: Payer: Self-pay

## 2021-03-25 VITALS — BP 145/68 | HR 70 | Temp 97.0°F | Resp 13 | Ht 70.0 in | Wt 229.0 lb

## 2021-03-25 DIAGNOSIS — K6289 Other specified diseases of anus and rectum: Secondary | ICD-10-CM | POA: Diagnosis not present

## 2021-03-25 DIAGNOSIS — D129 Benign neoplasm of anus and anal canal: Secondary | ICD-10-CM

## 2021-03-25 DIAGNOSIS — D128 Benign neoplasm of rectum: Secondary | ICD-10-CM

## 2021-03-25 DIAGNOSIS — K621 Rectal polyp: Secondary | ICD-10-CM | POA: Diagnosis not present

## 2021-03-25 MED ORDER — SODIUM CHLORIDE 0.9 % IV SOLN: 500.0000 mL | Freq: Once | INTRAVENOUS | Status: DC

## 2021-03-25 NOTE — Op Note (Signed)
[facility] Patient Name: Kristine Callahan Procedure Date: 03/25/2021 2:37 PM MRN: [id number] Endoscopist: Gatha Mayer , MD Age: 56 Referring MD:  Date of Birth: 03-28-1965 Gender: Female Account #: [id number] Procedure:                Flexible Sigmoidoscopy Indications:              High risk colon cancer surveillance: Ulcerative                            proctitis Medicines:                Monitored Anesthesia Care Procedure:                Pre-Anesthesia Assessment:                           - Prior to the procedure, a History and Physical                            was performed, and patient medications and                            allergies were reviewed. The patient's tolerance of                            previous anesthesia was also reviewed. The risks                            and benefits of the procedure and the sedation                            options and risks were discussed with the patient.                            All questions were answered, and informed consent                            was obtained. Prior Anticoagulants: The patient has                            taken no previous anticoagulant or antiplatelet                            agents. ASA Grade Assessment: II - A patient with                            mild systemic disease. After reviewing the risks                            and benefits, the patient was deemed in                            satisfactory condition to undergo the procedure.  After obtaining informed consent, the scope was                            passed under direct vision. The [id number]-HQ190L                            Colonoscope was introduced through the anus and                            advanced to the the sigmoid colon. The flexible                            sigmoidoscopy was accomplished without difficulty.                            The patient tolerated the procedure well. The                             quality of the bowel preparation was good. Scope In: Scope Out: Findings:                 The perianal and digital rectal examinations were                            normal.                           A 6 mm polyp was found in the rectum. The polyp was                            semi-pedunculated. The polyp was removed with a hot                            snare. Resection and retrieval were complete.                            Verification of patient identification for the                            specimen was done. Estimated blood loss: none.                           The exam was otherwise without abnormality.                           Biopsies were taken with a cold forceps in the                            rectum for histology. Verification of patient                            identification for the specimen was done. Estimated  blood loss was minimal. Complications:            No immediate complications. Estimated Blood Loss:     Estimated blood loss was minimal. Impression:               - One 6 mm polyp in the rectum, removed with a hot                            snare. Resected and retrieved.                           - The examination was otherwise normal. Significant                            improvement compared to 10/2020                           - Biopsies were taken with a cold forceps for                            histology in the rectum. Recommendation:           - Patient has a contact number available for                            emergencies. The signs and symptoms of potential                            delayed complications were discussed with the                            patient. Return to normal activities tomorrow.                            Written discharge instructions were provided to the                            patient.                           - Resume previous diet.                           - No  aspirin or NSAID x 2 weeks                           f/U pathology then will make further                            recommendations - 8/22 colon prep was fair will                            need to regroup about a repeat total colonoscopy                            when fully recovered from GYN surgery  Gatha Mayer, MD 03/25/2021 3:04:44 PM This report has been signed electronically.

## 2021-03-25 NOTE — Progress Notes (Signed)
Report to PACU, RN, vss, BBS= Clear.  

## 2021-03-25 NOTE — Progress Notes (Signed)
[facility] History and Physical   Primary Care Physician:  Leeroy Cha, MD   Reason for Procedure:   F/u proctitis after Tx  Plan:    sigmoidoscopy     HPI: Kristine Callahan is a 56 y.o. 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   COVID-19 12/2020   Headache    hx migraines none in years   Heart murmur, systolic    Negative echocardiogram 2016   Hyperlipidemia    on meds   Hypertension    on meds   Obesity    Prolapse of 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    Umbilical hernia    Uncontrolled type 2 diabetes mellitus with hyperglycemia, with long-term current use of insulin ([place]) 11/29/2019   on meds    Past Surgical History:  Procedure Laterality Date   ANTERIOR AND POSTERIOR REPAIR WITH SACROSPINOUS FIXATION N/A 01/21/2021   Procedure: SACROSPINOUS FIXATION;  Surgeon: [name], MD;  Location: [facility];  Service: Gynecology;  Laterality: N/A;   CHOLECYSTECTOMY     COLONOSCOPY  10/2020   CG-MAC-fair prep + ulcer tics, low sphincter tone   COLONOSCOPY WITH PROPOFOL N/A 05/31/2016   Procedure: COLONOSCOPY WITH PROPOFOL;  Surgeon: Garlan Fair, MD;  Location: [facility];  Service: Endoscopy;  Laterality: N/A;   RECTOCELE REPAIR N/A 01/21/2021   Procedure: POSTERIOR REPAIR (RECTOCELE) and perineorrhaphy;  Surgeon: [name], MD;  Location: [facility];  Service: Gynecology;  Laterality: N/A;    Prior to Admission medications   Medication Sig Start Date End Date Taking? Authorizing Provider  acetaminophen (TYLENOL) 500 MG tablet Take 1 tablet (500 mg total) by mouth every 6 (six) hours as needed (pain). 01/08/21  Yes [name], MD  aspirin 81 MG tablet Take 81 mg by mouth daily.   Yes [provider]  Blood Glucose Monitoring Suppl (ACCU-CHEK GUIDE ME) w/Device KIT by Does not apply  route.   Yes [provider]  Continuous Blood Gluc Sensor (DEXCOM G6 SENSOR) MISC 1 Device by Does not apply route as directed. 11/25/20  Yes Shamleffer, Melanie Crazier, MD  Continuous Blood Gluc Transmit (DEXCOM G6 TRANSMITTER) MISC 1 Device by Does not apply route as directed. 11/25/20  Yes Shamleffer, Melanie Crazier, MD  dapagliflozin propanediol (FARXIGA) 10 MG TABS tablet Take 1 tablet (10 mg total) by mouth daily. 11/25/20  Yes Shamleffer, Melanie Crazier, MD  fenofibrate 160 MG tablet Take 160 mg by mouth daily.   Yes [provider]  glucose blood (ACCU-CHEK GUIDE) test strip Check blood sugars 3 times daily/ E11.42 03/01/21  Yes Shamleffer, Melanie Crazier, MD  ibuprofen (ADVIL) 600 MG tablet Take 1 tablet (600 mg total) by mouth every 6 (six) hours as needed. 01/08/21  Yes [name], MD  insulin aspart (NOVOLOG FLEXPEN) 100 UNIT/ML FlexPen Max daily 60 units 11/25/20  Yes Shamleffer, Melanie Crazier, MD  insulin degludec (TRESIBA FLEXTOUCH) 100 UNIT/ML FlexTouch Pen Inject 30 Units into the skin daily. 11/25/20  Yes Shamleffer, Melanie Crazier, MD  Insulin Pen Needle 32G X 4 MM MISC 1 Device by Does not apply route in the morning, at noon, in the evening, and at bedtime. 11/27/20  Yes Shamleffer, Melanie Crazier, MD  liraglutide (VICTOZA) 18 MG/3ML SOPN Inject 1.8 mg into the skin daily. 11/25/20  Yes Shamleffer, Melanie Crazier, MD  lisinopril-hydrochlorothiazide (ZESTORETIC) 20-25 MG tablet Take 1 tablet by mouth  daily.   Yes [provider]  MELATONIN PO Take 1 tablet by mouth at bedtime.   Yes [provider]  mesalamine (CANASA) 1000 MG suppository Place 1 suppository (1,000 mg total) rectally at bedtime. 11/17/20  Yes Gatha Mayer, MD  metFORMIN (GLUCOPHAGE) 1000 MG tablet Take 1 tablet (1,000 mg total) by mouth 2 (two) times daily with a meal. 11/25/20  Yes Shamleffer, Melanie Crazier, MD  polyethylene glycol powder (GLYCOLAX/MIRALAX) 17 GM/SCOOP  powder Take 17 g by mouth daily. Drink 17g (1 scoop) dissolved in water per day. 01/08/21  Yes [name], MD  rosuvastatin (CRESTOR) 5 MG tablet Take 5 mg by mouth daily.   Yes [provider]  docusate sodium (COLACE) 100 MG capsule Take 1 capsule (100 mg total) by mouth 2 (two) times daily. Patient not taking: Reported on 03/25/2021 01/08/21   [name], MD  Nystatin (GERHARDT'S BUTT CREAM) CREA Apply 2-4 x a day to perianal rash Patient not taking: Reported on 03/02/2021 11/05/20   Gatha Mayer, MD  nystatin-triamcinolone ointment [facility]) Apply 1 application topically 2 (two) times daily. Patient taking differently: Apply 1 application topically 2 (two) times daily as needed. 10/08/20   Gatha Mayer, MD    Current Outpatient Medications  Medication Sig Dispense Refill   acetaminophen (TYLENOL) 500 MG tablet Take 1 tablet (500 mg total) by mouth every 6 (six) hours as needed (pain). 30 tablet 0   aspirin 81 MG tablet Take 81 mg by mouth daily.     Blood Glucose Monitoring Suppl (ACCU-CHEK GUIDE ME) w/Device KIT by Does not apply route.     Continuous Blood Gluc Sensor (DEXCOM G6 SENSOR) MISC 1 Device by Does not apply route as directed. 9 each 3   Continuous Blood Gluc Transmit (DEXCOM G6 TRANSMITTER) MISC 1 Device by Does not apply route as directed. 1 each 3   dapagliflozin propanediol (FARXIGA) 10 MG TABS tablet Take 1 tablet (10 mg total) by mouth daily. 90 tablet 3   fenofibrate 160 MG tablet Take 160 mg by mouth daily.     glucose blood (ACCU-CHEK GUIDE) test strip Check blood sugars 3 times daily/ E11.42 100 each 3   ibuprofen (ADVIL) 600 MG tablet Take 1 tablet (600 mg total) by mouth every 6 (six) hours as needed. 30 tablet 0   insulin aspart (NOVOLOG FLEXPEN) 100 UNIT/ML FlexPen Max daily 60 units 60 mL 1   insulin degludec (TRESIBA FLEXTOUCH) 100 UNIT/ML FlexTouch Pen Inject 30 Units into the skin daily. 30 mL 3   Insulin Pen Needle 32G X 4 MM  MISC 1 Device by Does not apply route in the morning, at noon, in the evening, and at bedtime. 400 each 2   liraglutide (VICTOZA) 18 MG/3ML SOPN Inject 1.8 mg into the skin daily. 27 mL 3   lisinopril-hydrochlorothiazide (ZESTORETIC) 20-25 MG tablet Take 1 tablet by mouth daily.     MELATONIN PO Take 1 tablet by mouth at bedtime.     mesalamine (CANASA) 1000 MG suppository Place 1 suppository (1,000 mg total) rectally at bedtime. 30 suppository 12   metFORMIN (GLUCOPHAGE) 1000 MG tablet Take 1 tablet (1,000 mg total) by mouth 2 (two) times daily with a meal. 180 tablet 3   polyethylene glycol powder (GLYCOLAX/MIRALAX) 17 GM/SCOOP powder Take 17 g by mouth daily. Drink 17g (1 scoop) dissolved in water per day. 255 g 0   rosuvastatin (CRESTOR) 5 MG tablet Take 5 mg by mouth daily.  docusate sodium (COLACE) 100 MG capsule Take 1 capsule (100 mg total) by mouth 2 (two) times daily. (Patient not taking: Reported on 03/25/2021) 60 capsule 0   Nystatin (GERHARDT'S BUTT CREAM) CREA Apply 2-4 x a day to perianal rash (Patient not taking: Reported on 03/02/2021) 1 each 3   nystatin-triamcinolone ointment (MYCOLOG) Apply 1 application topically 2 (two) times daily. (Patient taking differently: Apply 1 application topically 2 (two) times daily as needed.) 60 g 1   Current Facility-Administered Medications  Medication Dose Route Frequency Provider Last Rate Last Admin   0.9 %  sodium chloride infusion  500 mL Intravenous Once Gatha Mayer, MD        Allergies as of 03/25/2021 - Review Complete 03/25/2021  Allergen Reaction Noted   Latex Other (See Comments) 12/17/2014    Family History  Adopted: Yes    Social History   Socioeconomic History   Marital status: Married    Spouse name: Not on file   Number of children: Not on file   Years of education: Not on file   Highest education level: Not on file  Occupational History   Not on file  Tobacco Use   Smoking status: Never   Smokeless  tobacco: Never  Vaping Use   Vaping Use: Never used  Substance and Sexual Activity   Alcohol use: Not Currently    Comment: very rare   Drug use: No   Sexual activity: Not Currently  Other Topics Concern   Not on file  Social History Narrative   Patient is married with 3 sons   Never smoker, no alcohol   2 caffeinated beverages daily   No drug use no other tobacco      Review of Systems:  other review of systems negative except as mentioned in the HPI.  Physical Exam: Vital signs BP (!) 164/82    Pulse 76    Temp (!) 97 F (36.1 C) (Temporal)    Resp 13    Ht _0  (1.778 m)    Wt 229 lb (103.9 kg)    LMP  (LMP Unknown)    SpO2 98%    BMI 32.86 kg/m   General:   Alert,  Well-developed, well-nourished, pleasant and cooperative in NAD Lungs:  Clear throughout to auscultation.   Heart:  Regular rate and rhythm; no murmurs, clicks, rubs,  or gallops. Abdomen:  Soft, nontender and nondistended. Normal bowel sounds.  Obese and soft umbilical hernia Neuro/Psych:  Alert and cooperative. Normal mood and affect. A and O x 3   _1  E. Carlean Purl, MD, [facility] [phone] (pager) 03/25/2021 2:36 PM@

## 2021-03-25 NOTE — Patient Instructions (Addendum)
Overall the rectum looks good but there was a single small polyp that I removed. I also checked biopsies of the lining.  I will contact you with results and plans. It is ok to use 2-3 doses of MiraLax a day if necessary.  I appreciate the opportunity to care for you. Gatha Mayer, MD, [facility]  Handout for polyps provided   YOU HAD AN ENDOSCOPIC PROCEDURE TODAY AT [facility]:   Refer to the procedure report that was given to you for any specific questions about what was found during the examination.  If the procedure report does not answer your questions, please call your gastroenterologist to clarify.  If you requested that your care partner not be given the details of your procedure findings, then the procedure report has been included in a sealed envelope for you to review at your convenience later.  YOU SHOULD EXPECT: Some feelings of bloating in the abdomen. Passage of more gas than usual.  Walking can help get rid of the air that was put into your GI tract during the procedure and reduce the bloating. If you had a lower endoscopy (such as a colonoscopy or flexible sigmoidoscopy) you may notice spotting of blood in your stool or on the toilet paper. If you underwent a bowel prep for your procedure, you may not have a normal bowel movement for a few days.  Please Note:  You might notice some irritation and congestion in your nose or some drainage.  This is from the oxygen used during your procedure.  There is no need for concern and it should clear up in a day or so.  SYMPTOMS TO REPORT IMMEDIATELY:  Following lower endoscopy (colonoscopy or flexible sigmoidoscopy):  Excessive amounts of blood in the stool  Significant tenderness or worsening of abdominal pains  Swelling of the abdomen that is new, acute  Fever of 100F or higher  For urgent or emergent issues, a gastroenterologist can be reached at any hour by calling [phone]. Do not use MyChart messaging for  urgent concerns.    DIET:  We do recommend a small meal at first, but then you may proceed to your regular diet.  Drink plenty of fluids but you should avoid alcoholic beverages for 24 hours.  ACTIVITY:  You should plan to take it easy for the rest of today and you should NOT DRIVE or use heavy machinery until tomorrow (because of the sedation medicines used during the test).    FOLLOW UP: Our staff will call the number listed on your records 48-72 hours following your procedure to check on you and address any questions or concerns that you may have regarding the information given to you following your procedure. If we do not reach you, we will leave a message.  We will attempt to reach you two times.  During this call, we will ask if you have developed any symptoms of COVID 19. If you develop any symptoms (ie: fever, flu-like symptoms, shortness of breath, cough etc.) before then, please call [phone].  If you test positive for Covid 19 in the 2 weeks post procedure, please call and report this information to [place].    If any biopsies were taken you will be contacted by phone or by letter within the next 1-3 weeks.  Please call us at [phone] if you have not heard about the biopsies in 3 weeks.    SIGNATURES/CONFIDENTIALITY: You and/or your care partner have signed paperwork which will be  entered into your electronic medical record.  These signatures attest to the fact that that the information above on your After Visit Summary has been reviewed and is understood.  Full responsibility of the confidentiality of this discharge information lies with you and/or your care-partner.

## 2021-03-25 NOTE — Progress Notes (Signed)
Called to room to assist during endoscopic procedure.  Patient ID and intended procedure confirmed with present staff. Received instructions for my participation in the procedure from the performing physician.  

## 2021-03-25 NOTE — Progress Notes (Signed)
Pt's states no medical or surgical changes since previsit or office visit. 

## 2021-03-26 ENCOUNTER — Other Ambulatory Visit: Payer: Self-pay

## 2021-03-26 ENCOUNTER — Encounter (HOSPITAL_COMMUNITY): Payer: Self-pay | Admitting: Emergency Medicine

## 2021-03-26 ENCOUNTER — Telehealth: Payer: Self-pay | Admitting: Internal Medicine

## 2021-03-26 ENCOUNTER — Ambulatory Visit (HOSPITAL_COMMUNITY)
Admission: EM | Admit: 2021-03-26 | Discharge: 2021-03-26 | Disposition: A | Discharge status: Home / Self Care | Payer: BC Managed Care – PPO | Attending: Gastroenterology | Admitting: Gastroenterology

## 2021-03-26 ENCOUNTER — Encounter (HOSPITAL_COMMUNITY): Admission: EM | Disposition: A | Discharge status: Home / Self Care | Payer: Self-pay | Source: Home / Self Care

## 2021-03-26 DIAGNOSIS — Z79899 Other long term (current) drug therapy: Secondary | ICD-10-CM | POA: Insufficient documentation

## 2021-03-26 DIAGNOSIS — K429 Umbilical hernia without obstruction or gangrene: Secondary | ICD-10-CM | POA: Insufficient documentation

## 2021-03-26 DIAGNOSIS — Z860100 Personal history of colon polyps, unspecified: Secondary | ICD-10-CM

## 2021-03-26 DIAGNOSIS — K621 Rectal polyp: Secondary | ICD-10-CM | POA: Diagnosis not present

## 2021-03-26 DIAGNOSIS — K922 Gastrointestinal hemorrhage, unspecified: Secondary | ICD-10-CM | POA: Insufficient documentation

## 2021-03-26 DIAGNOSIS — K91840 Postprocedural hemorrhage and hematoma of a digestive system organ or structure following a digestive system procedure: Secondary | ICD-10-CM | POA: Insufficient documentation

## 2021-03-26 DIAGNOSIS — K625 Hemorrhage of anus and rectum: Secondary | ICD-10-CM

## 2021-03-26 DIAGNOSIS — K5120 Ulcerative (chronic) proctitis without complications: Secondary | ICD-10-CM | POA: Insufficient documentation

## 2021-03-26 DIAGNOSIS — E119 Type 2 diabetes mellitus without complications: Secondary | ICD-10-CM | POA: Diagnosis not present

## 2021-03-26 HISTORY — PX: HEMOSTASIS CLIP PLACEMENT: SHX6857

## 2021-03-26 HISTORY — PX: HOT HEMOSTASIS: SHX5433

## 2021-03-26 HISTORY — PX: FLEXIBLE SIGMOIDOSCOPY: SHX5431

## 2021-03-26 LAB — CBC WITH DIFFERENTIAL/PLATELET
Abs Immature Granulocytes: 0.03 10*3/uL (ref 0.00–0.07)
Basophils Absolute: 0 10*3/uL (ref 0.0–0.1)
Basophils Relative: 0 %
Eosinophils Absolute: 0.2 10*3/uL (ref 0.0–0.5)
Eosinophils Relative: 2 %
HCT: 39.3 % (ref 36.0–46.0)
Hemoglobin: 12.3 g/dL (ref 12.0–15.0)
Immature Granulocytes: 1 %
Lymphocytes Relative: 23 %
Lymphs Abs: 1.6 10*3/uL (ref 0.7–4.0)
MCH: 27.6 pg (ref 26.0–34.0)
MCHC: 31.3 g/dL (ref 30.0–36.0)
MCV: 88.3 fL (ref 80.0–100.0)
Monocytes Absolute: 0.5 10*3/uL (ref 0.1–1.0)
Monocytes Relative: 8 %
Neutro Abs: 4.3 10*3/uL (ref 1.7–7.7)
Neutrophils Relative %: 66 %
Platelets: 337 10*3/uL (ref 150–400)
RBC: 4.45 MIL/uL (ref 3.87–5.11)
RDW: 15.1 % (ref 11.5–15.5)
WBC: 6.6 10*3/uL (ref 4.0–10.5)
nRBC: 0 % (ref 0.0–0.2)

## 2021-03-26 LAB — BASIC METABOLIC PANEL
Anion gap: 10 (ref 5–15)
BUN: 26 mg/dL — ABNORMAL HIGH (ref 6–20)
CO2: 26 mmol/L (ref 22–32)
Calcium: 9.3 mg/dL (ref 8.9–10.3)
Chloride: 102 mmol/L (ref 98–111)
Creatinine, Ser: 0.84 mg/dL (ref 0.44–1.00)
GFR, Estimated: >60 mL/min (ref >60)
Glucose, Bld: 168 mg/dL — ABNORMAL HIGH (ref 70–99)
Potassium: 4 mmol/L (ref 3.5–5.1)
Sodium: 138 mmol/L (ref 135–145)

## 2021-03-26 LAB — TYPE AND SCREEN
ABO/RH(D): O POS
Antibody Screen: NEGATIVE

## 2021-03-26 LAB — ABO/RH: ABO/RH(D): O POS

## 2021-03-26 SURGERY — SIGMOIDOSCOPY, FLEXIBLE
Anesthesia: Moderate Sedation

## 2021-03-26 MED ORDER — SODIUM CHLORIDE 0.9 % IV SOLN: INTRAVENOUS | Status: DC

## 2021-03-26 MED ORDER — FENTANYL CITRATE (PF) 100 MCG/2ML IJ SOLN
INTRAMUSCULAR | Status: AC
  Filled 2021-03-26: qty 2

## 2021-03-26 MED ORDER — MIDAZOLAM HCL (PF) 5 MG/ML IJ SOLN
INTRAMUSCULAR | Status: AC
  Filled 2021-03-26: qty 1

## 2021-03-26 NOTE — Telephone Encounter (Signed)
Pt states that she is having Bright Red Blood per rectum starting today. Pt states that she had a Flex Sig done yesterday. Pt stated that she has had 8 BMs today starting this AM. Pt stated that there was some stool this AM along with the bright red Blood although as the day has progressed it has only been bright red blood. Pt states that the frequency has increased throughout the day.  Please Advise

## 2021-03-26 NOTE — Telephone Encounter (Signed)
Patient called and stated that she had a procedure done yesterday and this morning she started bleeding from her rectum. Seeking advise, Please advise.

## 2021-03-26 NOTE — Consult Note (Signed)
Consultation  Referring Provider:    ED staff  Primary Care Physician:  Leeroy Cha, MD Primary Gastroenterologist:        Dr. Carlean Purl Reason for Consultation:   rectal bleeding           HPI:   Kristine Callahan is a 56 y.o. 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 Carlean Purl for rectal bleeding.  She underwent flex sig with Dr. Carlean Purl yesterday for surveillance of ulcerative proctitis.  Her colon had intervally healed and there was no overt inflammation but she had a 6 mm inflammatory appearing polyp removed via hot snare.  She states she tolerated the procedure well without complaints at the time.  Unfortunately this morning she woke with rectal bleeding.  She states she passed some bright red blood with some clots.  Throughout the day she has had multiple episodes with increasing frequency.  She had a very large episode of bleeding at 3:30 PM.  Lesser volume episodes since then but more frequently.  Passing mostly blood and clot.  She denies any abdominal pain other than some cramping in her lower abdomen.  She does not take any blood thinners other than baby aspirin.  There is a very long wait in the emergency department tonight, fortunately the ED staff was able to accommodate this patient and bring her to her room quickly.  BP 174/85 with a heart rate of 82.  She is currently not in any pain.  She had labs drawn showing hemoglobin of 12.3.  She is accompanied by her husband in the waiting room tonight.  Otherwise no chest pain or shortness of breath.  No syncope.  She is rather anxious about this occurrence and we discussed options below.   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   COVID-19 12/2020   Headache    hx migraines none in years   Heart murmur, systolic    Negative echocardiogram 2016    Hyperlipidemia    on meds   Hypertension    on meds   Obesity    Prolapse of 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    Umbilical hernia    Uncontrolled type 2 diabetes mellitus with hyperglycemia, with long-term current use of insulin ([place]) 11/29/2019   on meds    Past Surgical History:  Procedure Laterality Date   ANTERIOR AND POSTERIOR REPAIR WITH SACROSPINOUS FIXATION N/A 01/21/2021   Procedure: SACROSPINOUS FIXATION;  Surgeon: [name], MD;  Location: [facility];  Service: Gynecology;  Laterality: N/A;   CHOLECYSTECTOMY     COLONOSCOPY  10/2020   CG-MAC-fair prep + ulcer tics, low sphincter tone   COLONOSCOPY WITH PROPOFOL N/A 05/31/2016   Procedure: COLONOSCOPY WITH PROPOFOL;  Surgeon: Garlan Fair, MD;  Location: [facility];  Service: Endoscopy;  Laterality: N/A;   RECTOCELE REPAIR N/A 01/21/2021   Procedure: POSTERIOR REPAIR (RECTOCELE) and perineorrhaphy;  Surgeon: [name], MD;  Location: [facility];  Service: Gynecology;  Laterality: N/A;    Family History  Adopted: Yes     Social History   Tobacco Use   Smoking status: Never   Smokeless tobacco: Never  Vaping Use   Vaping Use: Never used  Substance Use Topics   Alcohol use: Not Currently    Comment: very rare   Drug use: No  Prior to Admission medications   Medication Sig Start Date End Date Taking? Authorizing Provider  acetaminophen (TYLENOL) 500 MG tablet Take 1 tablet (500 mg total) by mouth every 6 (six) hours as needed (pain). 01/08/21   [name], MD  aspirin 81 MG tablet Take 81 mg by mouth daily.    [provider]  Blood Glucose Monitoring Suppl (ACCU-CHEK GUIDE ME) w/Device KIT by Does not apply route.    [provider]  Continuous Blood Gluc Sensor (DEXCOM G6 SENSOR) MISC 1 Device by Does not apply route as directed. 11/25/20   Shamleffer, Melanie Crazier, MD  Continuous Blood Gluc Transmit (DEXCOM G6 TRANSMITTER) MISC 1  Device by Does not apply route as directed. 11/25/20   Shamleffer, Melanie Crazier, MD  dapagliflozin propanediol (FARXIGA) 10 MG TABS tablet Take 1 tablet (10 mg total) by mouth daily. 11/25/20   Shamleffer, Melanie Crazier, MD  docusate sodium (COLACE) 100 MG capsule Take 1 capsule (100 mg total) by mouth 2 (two) times daily. Patient not taking: Reported on 03/25/2021 01/08/21   [name], MD  fenofibrate 160 MG tablet Take 160 mg by mouth daily.    [provider]  glucose blood (ACCU-CHEK GUIDE) test strip Check blood sugars 3 times daily/ E11.42 03/01/21   Shamleffer, Melanie Crazier, MD  ibuprofen (ADVIL) 600 MG tablet Take 1 tablet (600 mg total) by mouth every 6 (six) hours as needed. 01/08/21   [name], MD  insulin aspart (NOVOLOG FLEXPEN) 100 UNIT/ML FlexPen Max daily 60 units 11/25/20   Shamleffer, Melanie Crazier, MD  insulin degludec (TRESIBA FLEXTOUCH) 100 UNIT/ML FlexTouch Pen Inject 30 Units into the skin daily. 11/25/20   Shamleffer, Melanie Crazier, MD  Insulin Pen Needle 32G X 4 MM MISC 1 Device by Does not apply route in the morning, at noon, in the evening, and at bedtime. 11/27/20   Shamleffer, Melanie Crazier, MD  liraglutide (VICTOZA) 18 MG/3ML SOPN Inject 1.8 mg into the skin daily. 11/25/20   Shamleffer, Melanie Crazier, MD  lisinopril-hydrochlorothiazide (ZESTORETIC) 20-25 MG tablet Take 1 tablet by mouth daily.    [provider]  MELATONIN PO Take 1 tablet by mouth at bedtime.    [provider]  mesalamine (CANASA) 1000 MG suppository Place 1 suppository (1,000 mg total) rectally at bedtime. 11/17/20   Gatha Mayer, MD  metFORMIN (GLUCOPHAGE) 1000 MG tablet Take 1 tablet (1,000 mg total) by mouth 2 (two) times daily with a meal. 11/25/20   Shamleffer, Melanie Crazier, MD  Nystatin (GERHARDT'S BUTT CREAM) CREA Apply 2-4 x a day to perianal rash Patient not taking: Reported on 03/02/2021 11/05/20   Gatha Mayer, MD   nystatin-triamcinolone ointment [facility]) Apply 1 application topically 2 (two) times daily. Patient taking differently: Apply 1 application topically 2 (two) times daily as needed. 10/08/20   Gatha Mayer, MD  polyethylene glycol powder (GLYCOLAX/MIRALAX) 17 GM/SCOOP powder Take 17 g by mouth daily. Drink 17g (1 scoop) dissolved in water per day. 01/08/21   [name], MD  rosuvastatin (CRESTOR) 5 MG tablet Take 5 mg by mouth daily.    [provider]    Current Facility-Administered Medications  Medication Dose Route Frequency Provider Last Rate Last Admin   0.9 %  sodium chloride infusion   Intravenous Continuous Tariyah Pendry, Carlota Raspberry, MD       Current Outpatient Medications  Medication Sig Dispense Refill   acetaminophen (TYLENOL) 500 MG tablet Take 1 tablet (500 mg total)  by mouth every 6 (six) hours as needed (pain). 30 tablet 0   aspirin 81 MG tablet Take 81 mg by mouth daily.     Blood Glucose Monitoring Suppl (ACCU-CHEK GUIDE ME) w/Device KIT by Does not apply route.     Continuous Blood Gluc Sensor (DEXCOM G6 SENSOR) MISC 1 Device by Does not apply route as directed. 9 each 3   Continuous Blood Gluc Transmit (DEXCOM G6 TRANSMITTER) MISC 1 Device by Does not apply route as directed. 1 each 3   dapagliflozin propanediol (FARXIGA) 10 MG TABS tablet Take 1 tablet (10 mg total) by mouth daily. 90 tablet 3   docusate sodium (COLACE) 100 MG capsule Take 1 capsule (100 mg total) by mouth 2 (two) times daily. (Patient not taking: Reported on 03/25/2021) 60 capsule 0   fenofibrate 160 MG tablet Take 160 mg by mouth daily.     glucose blood (ACCU-CHEK GUIDE) test strip Check blood sugars 3 times daily/ E11.42 100 each 3   ibuprofen (ADVIL) 600 MG tablet Take 1 tablet (600 mg total) by mouth every 6 (six) hours as needed. 30 tablet 0   insulin aspart (NOVOLOG FLEXPEN) 100 UNIT/ML FlexPen Max daily 60 units 60 mL 1   insulin degludec (TRESIBA FLEXTOUCH) 100 UNIT/ML FlexTouch  Pen Inject 30 Units into the skin daily. 30 mL 3   Insulin Pen Needle 32G X 4 MM MISC 1 Device by Does not apply route in the morning, at noon, in the evening, and at bedtime. 400 each 2   liraglutide (VICTOZA) 18 MG/3ML SOPN Inject 1.8 mg into the skin daily. 27 mL 3   lisinopril-hydrochlorothiazide (ZESTORETIC) 20-25 MG tablet Take 1 tablet by mouth daily.     MELATONIN PO Take 1 tablet by mouth at bedtime.     mesalamine (CANASA) 1000 MG suppository Place 1 suppository (1,000 mg total) rectally at bedtime. 30 suppository 12   metFORMIN (GLUCOPHAGE) 1000 MG tablet Take 1 tablet (1,000 mg total) by mouth 2 (two) times daily with a meal. 180 tablet 3   Nystatin (GERHARDT'S BUTT CREAM) CREA Apply 2-4 x a day to perianal rash (Patient not taking: Reported on 03/02/2021) 1 each 3   nystatin-triamcinolone ointment (MYCOLOG) Apply 1 application topically 2 (two) times daily. (Patient taking differently: Apply 1 application topically 2 (two) times daily as needed.) 60 g 1   polyethylene glycol powder (GLYCOLAX/MIRALAX) 17 GM/SCOOP powder Take 17 g by mouth daily. Drink 17g (1 scoop) dissolved in water per day. 255 g 0   rosuvastatin (CRESTOR) 5 MG tablet Take 5 mg by mouth daily.      Allergies as of 03/26/2021 - Review Complete 03/26/2021  Allergen Reaction Noted   Latex Other (See Comments) 12/17/2014     Review of Systems:    As per HPI, otherwise negative    Physical Exam:  Vital signs in last 24 hours: Temp:  [98.1 F (36.7 C)] 98.1 F (36.7 C) (01/06 1817) Pulse Rate:  [82] 82 (01/06 1817) Resp:  [20] 20 (01/06 1817) BP: (174)/(85) 174/85 (01/06 1817) SpO2:  [98 %] 98 % (01/06 1817)   General:   Pleasant female in NAD Head:  Normocephalic and atraumatic. Eyes:   No icterus.   Conjunctiva pink. Ears:  Normal auditory acuity. Neck:  Supple Lungs:  Respirations even and unlabored. Lungs clear to auscultation bilaterally.   No wheezes, crackles, or rhonchi.  Heart:  Regular rate  and rhythm; no MRG Abdomen:  Soft, nondistended, nontender. Protuberant abdomen with  umbilical hernia.   Rectal:  Not performed.  Msk:  Symmetrical without gross deformities.  Extremities:  Without edema. Neurologic:  Alert and  oriented x4;  grossly normal neurologically. Skin:  Intact without significant lesions or rashes. Psych:  Alert and cooperative. Normal affect.  LAB RESULTS: Recent Labs    03/26/21 1832  WBC 6.6  HGB 12.3  HCT 39.3  PLT 337   BMET Recent Labs    03/26/21 1832  NA 138  K 4.0  CL 102  CO2 26  GLUCOSE 168*  BUN 26*  CREATININE 0.84  CALCIUM 9.3   LFT No results for input(s): PROT, ALBUMIN, AST, ALT, ALKPHOS, BILITOT, BILIDIR, IBILI in the last 72 hours. PT/INR No results for input(s): LABPROT, INR in the last 72 hours.  STUDIES: No results found.     Impression / Plan:   56 year old female presenting to the ED with recurrent episodes of rectal bleeding following flex sig yesterday with removal of inflammatory appearing polyp from the rectum.  Her proctitis had intervally healed.  Very likely having post polypectomy related bleeding, while bleeding from biopsy site seems very unlikely.  Fortunately she is hemodynamically stable and her hemoglobin is stable as well which is reassuring.  I offered her flex sig in the ED tonight unprepped given the location of this polyp and likely source of her bleeding will likely be easily identifiable without a prep.  I discussed risks and benefits of the procedure and she wants to proceed.  We discussed if she wanted sedation or not for this.  Her preference is to do this awake if she thinks she can tolerate it and I think she very likely will be fine doing this without anesthesia.  We are prepared to give conscious sedation if needed.  Hopefully we can treat with hemostasis clips, stop bleeding and possibly discharge her home if she tolerates this well and good hemostasis.  After discussion of the situation she is  agreeable to proceed with flex sig tonight, further recommendations pending the results.  She is very understanding of this occurrence and understands this is a rare complication from colonoscopy and removing polyps.  Jolly Mango, MD [facility]

## 2021-03-26 NOTE — ED Provider Triage Note (Signed)
Emergency Medicine Provider Triage Evaluation Note  SHANDI GODFREY , a 56 y.o. female  was evaluated in triage.  Pt complains of no bleeding.  She had a flex sigmoidoscopy yesterday.  She did not have any problems until l this morning when she has been experiencing rectal bleeding.  Has been soaking pads with the blood from her rectum.  Denies any dizziness, shortness of breath, chest pain palpitations or syncope  Called Dr. Carlean Purl who sent her to the emergency department. Review of Systems  As above  Physical Exam  BP (!) 174/85    Pulse 82    Temp 98.1 F (36.7 C) (Oral)    Resp 20    LMP  (LMP Unknown)    SpO2 98%  Gen:   Awake, no distress   Resp:  Normal effort  MSK:   Moves extremities without difficulty  Other:  Photos on patient's phone revealing of blood in toilet as well as large clots passed.  No signs of stool.  Medical Decision Making  Medically screening exam initiated at 6:20 PM.  Appropriate orders placed.  GENIECE AKERS was informed that the remainder of the evaluation will be completed by another provider, this initial triage assessment does not replace that evaluation, and the importance of remaining in the ED until their evaluation is complete.       Rhae Hammock, PA-C 03/26/21 1828

## 2021-03-26 NOTE — Op Note (Addendum)
[facility] Patient Name: Kristine Callahan Procedure Date: 03/26/2021 MRN: [id number] Attending MD: Carlota Raspberry. Havery Moros , MD Date of Birth: March 06, 1966 CSN: [id number] Age: 56 Admit Type: Outpatient Procedure:                Flexible Sigmoidoscopy Indications:              rectal bleeding, recent polypectomy of rectal                            polyp, treatment of bleeding from polypectomy site Providers:                Carlota Raspberry. Havery Moros, MD, Jaci Carrel, RN,                            William Dalton, Technician Referring MD:              Medicines:                no sedate - patient tolerated the procedure well                            aake Complications:            No immediate complications. Estimated blood loss:                            Minimal. Estimated Blood Loss:     Estimated blood loss was minimal. Procedure:                Pre-Anesthesia Assessment:                           - Prior to the procedure, a History and Physical                            was performed, and patient medications and                            allergies were reviewed. The patient's tolerance of                            previous anesthesia was also reviewed. The risks                            and benefits of the procedure and the sedation                            options and risks were discussed with the patient.                            All questions were answered, and informed consent                            was obtained. Prior Anticoagulants: The patient has  taken no previous anticoagulant or antiplatelet                            agents. ASA Grade Assessment: III - A patient with                            severe systemic disease. After reviewing the risks                            and benefits, the patient was deemed in                            satisfactory condition to undergo the procedure.                           After  obtaining informed consent, the scope was                            passed under direct vision. The [id number]-HQ190L                            ([id number]) Olympus colonoscope was introduced                            through the anus and advanced to the the sigmoid                            colon. The flexible sigmoidoscopy was accomplished                            without difficulty. The patient tolerated the                            procedure well. The quality of the bowel                            preparation was adequate following lavage. Scope In: 9:00:38 PM Scope Out: 9:37:07 PM Total Procedure Duration: 0 hours 36 minutes 29 seconds  Findings:      The perianal and digital rectal examinations were normal.      Clotted and red blood was found in the rectum and in the recto-sigmoid       colon whjch was lavaged.      The distal sigmoid colon appeared normal. Brown stool noted there.      Active bleeding was seen in the rectum, at site of prior polypectomy       site. There was an adherent clot overlying the site from which blood       could be seen actively oozing from the base of it. The clot was removed       revealing a polypectomy ulcer base with 2 areas of bleeding from visible       vessel. Fulguration to stop the bleeding intially by goldprobe was       immediately successful. To prevent rebleeding given large ulcer base,       three hemostatic clips were successfully  placed across the site. The       tissue quite friable in this area - 3 clips placed successfully (large       23m clips, one misfired / did not adhere). No bleeding after this       intervention.      The previous biopsy sites were noted with small adherent clot and no       bleeding noted. The exam was otherwise without abnormality. Retroflexed       views not obtained. Impression:               - Blood in the rectum and in the recto-sigmoid                            colon.                           - The  distal sigmoid colon is normal.                           - Bleeding in the rectum secondary to previous                            polypectomy. Treated with gold probe and 3 clips                            were placed with good hemostasis.                           - The examination was otherwise normal.                           Discussed options with the patient post-procedure.                            She was offered observation overnight in the                            hospital vs. discharge home with close precautions.                            She strongly wished to go home this evening,                            precautions given to return if any significant                            rebleeding. She understands there is risk for                            recurrent bleeding as polypectomy site continues to                            heal. Moderate Sedation:      No moderate sedation, case performed with MAC Recommendation:           - Discharge  patient to home.                           - Clear liquid diet tonight, advance diet slowly                            tomorrow as tolerated.                           - Continue present medications.                           - No NSAIDS                           - Return to the hospital with any recurrent                            persistent bleeding symptoms                           - Hold [place] for the next week - to avoid                            disrupting clips Procedure Code(s):        --- Professional ---                           [phone], 78, Sigmoidoscopy, flexible; with control of                            bleeding, any method Diagnosis Code(s):        --- Professional ---                           K62.5, Hemorrhage of anus and rectum                           K92.2, Gastrointestinal hemorrhage, unspecified                           K91.840, Postprocedural hemorrhage of a digestive                            system  organ or structure following a digestive                            system procedure CPT copyright 2019 American Medical Association. All rights reserved. The codes documented in this report are preliminary and upon coder review may  be revised to meet current compliance requirements. Remo Lipps P. Lestat Golob, MD 03/26/2021 10:13:39 PM This report has been signed electronically. Number of Addenda: 0

## 2021-03-26 NOTE — ED Triage Notes (Signed)
Patient had a flex sigmoid yesterday, was okay yesterday and today has been having rectal bleeding, reports 'diarrhea but just clots and blood.' Denies dizziness, SOB.

## 2021-03-26 NOTE — Discharge Instructions (Signed)
YOU HAD AN ENDOSCOPIC PROCEDURE TODAY: Refer to the procedure report and other information in the discharge instructions given to you for any specific questions about what was found during the examination. If this information does not answer your questions, please call [place] office at [phone] to clarify.  ° °YOU SHOULD EXPECT: Some feelings of bloating in the abdomen. Passage of more gas than usual. Walking can help get rid of the air that was put into your GI tract during the procedure and reduce the bloating. If you had a lower endoscopy (such as a colonoscopy or flexible sigmoidoscopy) you may notice spotting of blood in your stool or on the toilet paper. Some abdominal soreness may be present for a day or two, also. ° °DIET: Your first meal following the procedure should be a light meal and then it is ok to progress to your normal diet. A half-sandwich or bowl of soup is an example of a good first meal. Heavy or fried foods are harder to digest and may make you feel nauseous or bloated. Drink plenty of fluids but you should avoid alcoholic beverages for 24 hours. If you had a esophageal dilation, please see attached instructions for diet.   ° °ACTIVITY: Your care partner should take you home directly after the procedure. You should plan to take it easy, moving slowly for the rest of the day. You can resume normal activity the day after the procedure however YOU SHOULD NOT DRIVE, use power tools, machinery or perform tasks that involve climbing or major physical exertion for 24 hours (because of the sedation medicines used during the test).  ° °SYMPTOMS TO REPORT IMMEDIATELY: °A gastroenterologist can be reached at any hour. Please call [phone]  for any of the following symptoms:  °Following lower endoscopy (colonoscopy, flexible sigmoidoscopy) °Excessive amounts of blood in the stool  °Significant tenderness, worsening of abdominal pains  °Swelling of the abdomen that is new, acute  °Fever of 100° or  higher  °Following upper endoscopy (EGD, EUS, ERCP, esophageal dilation) °Vomiting of blood or coffee ground material  °New, significant abdominal pain  °New, significant chest pain or pain under the shoulder blades  °Painful or persistently difficult swallowing  °New shortness of breath  °Black, tarry-looking or red, bloody stools ° °FOLLOW UP:  °If any biopsies were taken you will be contacted by phone or by letter within the next 1-3 weeks. Call [phone]  if you have not heard about the biopsies in 3 weeks.  °Please also call with any specific questions about appointments or follow up tests. ° °

## 2021-03-26 NOTE — Telephone Encounter (Signed)
Spoke to patient Having multiple passages of blood + clots  Suspect she is bleeding from polypectomy site in rectum. Have advised she go to ED at [facility]  Have reviewed w/ on call MD Dr. Havery Moros as well

## 2021-03-29 ENCOUNTER — Encounter (HOSPITAL_COMMUNITY): Payer: Self-pay | Admitting: Gastroenterology

## 2021-03-29 ENCOUNTER — Telehealth: Payer: Self-pay | Admitting: *Deleted

## 2021-03-29 NOTE — Telephone Encounter (Signed)
°  Follow up Call-  Call back number 03/25/2021 11/05/2020  Post procedure Call Back phone  # [phone]  Permission to leave phone message Yes Yes  Some recent data might be hidden     Patient questions:  Do you have a fever, pain , or abdominal swelling? No. Pain Score  0 *  Have you tolerated food without any problems? Yes.    Have you been able to return to your normal activities? Yes.    Do you have any questions about your discharge instructions: Diet   No. Medications  No. Follow up visit  No.  Do you have questions or concerns about your Care? No.  Actions: * If pain score is 4 or above: No action needed, pain <4.  Patient doing well after re-bleeding incident. No further issues.

## 2021-03-30 ENCOUNTER — Ambulatory Visit
Admission: RE | Admit: 2021-03-30 | Discharge: 2021-03-30 | Disposition: A | Discharge status: Home / Self Care | Payer: BC Managed Care – PPO | Source: Ambulatory Visit | Attending: Internal Medicine | Admitting: Internal Medicine

## 2021-03-30 DIAGNOSIS — Z1231 Encounter for screening mammogram for malignant neoplasm of breast: Secondary | ICD-10-CM

## 2021-05-05 ENCOUNTER — Ambulatory Visit: Payer: BC Managed Care – PPO | Admitting: Obstetrics and Gynecology

## 2021-05-05 DIAGNOSIS — E119 Type 2 diabetes mellitus without complications: Secondary | ICD-10-CM | POA: Diagnosis not present

## 2021-05-06 ENCOUNTER — Ambulatory Visit: Payer: BC Managed Care – PPO | Admitting: Obstetrics and Gynecology

## 2021-05-14 ENCOUNTER — Ambulatory Visit (INDEPENDENT_AMBULATORY_CARE_PROVIDER_SITE_OTHER): Payer: BC Managed Care – PPO | Admitting: Internal Medicine

## 2021-05-14 ENCOUNTER — Encounter: Payer: Self-pay | Admitting: Internal Medicine

## 2021-05-14 VITALS — BP 154/82 | HR 98 | Ht 70.0 in | Wt 237.0 lb

## 2021-05-14 DIAGNOSIS — E669 Obesity, unspecified: Secondary | ICD-10-CM

## 2021-05-14 DIAGNOSIS — K429 Umbilical hernia without obstruction or gangrene: Secondary | ICD-10-CM

## 2021-05-14 DIAGNOSIS — K5120 Ulcerative (chronic) proctitis without complications: Secondary | ICD-10-CM | POA: Diagnosis not present

## 2021-05-14 DIAGNOSIS — M6208 Separation of muscle (nontraumatic), other site: Secondary | ICD-10-CM | POA: Diagnosis not present

## 2021-05-14 DIAGNOSIS — E8881 Metabolic syndrome: Secondary | ICD-10-CM | POA: Diagnosis not present

## 2021-05-14 DIAGNOSIS — E1165 Type 2 diabetes mellitus with hyperglycemia: Secondary | ICD-10-CM

## 2021-05-14 DIAGNOSIS — Z794 Long term (current) use of insulin: Secondary | ICD-10-CM

## 2021-05-14 NOTE — Progress Notes (Signed)
Kristine Callahan 56 y.o. 1965-10-28 [id number]  Assessment & Plan:   Encounter Diagnoses  Name Primary?   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  She has recovered from her post polypectomy bleed.  She is much improved after posterior repair surgery from Dr. Wannetta Sender.  Plan is to withdraw mesalamine suppository therapy and see how she does.  I explained that in my experience there are patterns of long remission after several months of treatment, intermittent therapy needed and chronic daily therapy needed.  She understands we are going to sort this out for her.  Previously I had mentioned that I thought I needed to repeat a colonoscopy because her colonoscopy last year had a fair prep more proximally.  However she did have a colonoscopy in 2018 with a good prep and no polyps so I think we can wait until 2028 for a routine colonoscopy and investigate signs or symptoms earlier as needed.  We had a conversation about her abdominal hernia and abdominal adiposity today.  I will refer her to [facility] surgery and we talked about her weight issues.  She is a self-described "carboholic", and also think she has an eating disorder in that respect.  I have given her some references regarding insulin resistance and intermittent fasting to look at, I have instructed her not to act on any of that as she is an insulin-dependent type 2 diabetic.  I do think a low carbohydrate diet would help her based upon my knowledge and understanding of current practices.  Perhaps an ultralow carbohydrate diet i.e. ketogenic.  However that may be difficult given her  self-described addictions but I think the stakes are high with her current status at age 41 and something like this is worth pursuing.  Another option might be pharmacologic therapy but though I know that is helpful, she is already on liraglutide question switch to semaglutide but not sure how much since that makes or how feasible.     She has an upcoming endocrinology appointment and I suggested she discuss with Dr. Kelton Pillar as well.  I did explain that in some areas my support of lower carbohydrate diets and even ketogenic diet is perceived is controversial by some physicians though I have seen some excellent results when pursued.  Clearly there is no "1 size fits all".  CC: Leeroy Cha, MD Dr. Kelton Pillar Subjective:   Chief Complaint: Follow-up of ulcerative proctitis  HPI Kristine Callahan returns stating that she is doing quite well with respect to her bowel symptoms no diarrhea no fecal seepage no perianal dermatitis issues.  Recall that she had problems with chronic ulcerative proctitis, remission was documented on March 25, 2021 sigmoidoscopy.  There was a polyp that I removed that turned out to be inflammatory/juvenile polyp and she unfortunately had a post polypectomy bleed but Dr. Havery Moros was able to clip it and discharge her same day and she has done well.  Hemoglobin was normal that day.  Wt Readings from Last 3 Encounters:  05/14/21 237 lb (107.5 kg)  03/26/21 229 lb 0.9 oz (103.9 kg)  03/25/21 229 lb (103.9 kg)    She feels her posterior repair surgery was  a success.  She is concerned about her diastases recti and her umbilical hernia and wants to know what can be done about them.  She has gained weight and is distressed but admits she has problems with food addiction type issues.  Please see the assessment and plan for further details on this.  Allergies  Allergen Reactions   Latex Other (See Comments)    Taste sensation    Current Meds  Medication Sig    acetaminophen (TYLENOL) 500 MG tablet Take 1 tablet (500 mg total) by mouth every 6 (six) hours as needed (pain).   aspirin 81 MG tablet Take 81 mg by mouth daily.   Blood Glucose Monitoring Suppl (ACCU-CHEK GUIDE ME) w/Device KIT by Does not apply route.   Continuous Blood Gluc Sensor (DEXCOM G6 SENSOR) MISC 1 Device by Does not apply route as directed.   Continuous Blood Gluc Transmit (DEXCOM G6 TRANSMITTER) MISC 1 Device by Does not apply route as directed.   dapagliflozin propanediol (FARXIGA) 10 MG TABS tablet Take 1 tablet (10 mg total) by mouth daily.   docusate sodium (COLACE) 100 MG capsule Take 1 capsule (100 mg total) by mouth 2 (two) times daily.   fenofibrate 160 MG tablet Take 160 mg by mouth daily.   glucose blood (ACCU-CHEK GUIDE) test strip Check blood sugars 3 times daily/ E11.42   ibuprofen (ADVIL) 600 MG tablet Take 1 tablet (600 mg total) by mouth every 6 (six) hours as needed.   insulin aspart (NOVOLOG FLEXPEN) 100 UNIT/ML FlexPen Max daily 60 units   insulin degludec (TRESIBA FLEXTOUCH) 100 UNIT/ML FlexTouch Pen Inject 30 Units into the skin daily.   Insulin Pen Needle 32G X 4 MM MISC 1 Device by Does not apply route in the morning, at noon, in the evening, and at bedtime.   liraglutide (VICTOZA) 18 MG/3ML SOPN Inject 1.8 mg into the skin daily.   lisinopril-hydrochlorothiazide (ZESTORETIC) 20-25 MG tablet Take 1 tablet by mouth daily.   MELATONIN PO Take 1 tablet by mouth at bedtime.   mesalamine (CANASA) 1000 MG suppository Place 1 suppository (1,000 mg total) rectally at bedtime.   metFORMIN (GLUCOPHAGE) 1000 MG tablet Take 1 tablet (1,000 mg total) by mouth 2 (two) times daily with a meal.   polyethylene glycol powder (GLYCOLAX/MIRALAX) 17 GM/SCOOP powder Take 17 g by mouth daily. Drink 17g (1 scoop) dissolved in water per day.   rosuvastatin (CRESTOR) 5 MG tablet Take 5 mg by mouth daily.   Past Medical History:  Diagnosis Date   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[place])  11/17/2020   COVID-19 12/2020   Headache    hx migraines none in years   Heart murmur, systolic    Negative echocardiogram 2016   Hyperlipidemia    on meds   Hypertension    on meds   Obesity    Prolapse of 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    Umbilical hernia    Uncontrolled type 2 diabetes mellitus with hyperglycemia, with long-term current use of insulin ([place]) 11/29/2019   on meds   Past Surgical History:  Procedure Laterality Date   ANTERIOR AND POSTERIOR REPAIR WITH SACROSPINOUS FIXATION N/A 01/21/2021   Procedure: SACROSPINOUS FIXATION;  Surgeon: [name], MD;  Location[facility];  Service: Gynecology;  Laterality: N/A;   CHOLECYSTECTOMY     COLONOSCOPY  10/2020   CG-MAC-fair prep + ulcer tics, low sphincter tone   COLONOSCOPY WITH PROPOFOL N/A 05/31/2016   Procedure: COLONOSCOPY WITH PROPOFOL;  Surgeon: Ursula Alert  Wynetta Emery, MD;  Location: [facility];  Service: Endoscopy;  Laterality: N/A;   FLEXIBLE SIGMOIDOSCOPY N/A 03/26/2021   Procedure: FLEXIBLE SIGMOIDOSCOPY;  Surgeon: Yetta Flock, MD;  Location: [facility];  Service: Gastroenterology;  Laterality: N/A;   HEMOSTASIS CLIP PLACEMENT  03/26/2021   Procedure: HEMOSTASIS CLIP PLACEMENT;  Surgeon: Yetta Flock, MD;  Location: [facility];  Service: Gastroenterology;;   HOT HEMOSTASIS N/A 03/26/2021   Procedure: HOT HEMOSTASIS (ARGON PLASMA COAGULATION/BICAP);  Surgeon: Yetta Flock, MD;  Location: [facility];  Service: Gastroenterology;  Laterality: N/A;   RECTOCELE REPAIR N/A 01/21/2021   Procedure: POSTERIOR REPAIR (RECTOCELE) and perineorrhaphy;  Surgeon: [name], MD;  Location: [facility];  Service: Gynecology;  Laterality: N/A;   Social History   Social History Narrative   Patient is married with 3 sons   Never smoker, no alcohol   2 caffeinated beverages daily   No drug use no other tobacco   family history is not on file. She was  adopted.   Review of Systems As per HPI  Objective:   Physical Exam BP (!) 154/82    Pulse 98    Ht _0  (1.778 m)    Wt 237 lb (107.5 kg)    LMP  (LMP Unknown)    SpO2 93%    BMI 34.01 kg/m  Abd obese w/ diastsis rect and small reducible umbilical hernia

## 2021-05-14 NOTE — Patient Instructions (Addendum)
The Obesity Code by Dr. Sharman Cheek  Intermittent Fasting Diet Guide and Cookbook by Dr. Alberteen Spindle   Also check out Dr. Enrigue Catena at [facility] re: low carb/keto diets  Another possible option is a company called [employer] that may be an option and possibly even covered by insurance (low carb eating help)    Stop your mesalamine.  Check back with Dr. Carlean Purl through my chart in about 6 months or sooner if necessary

## 2021-05-24 ENCOUNTER — Other Ambulatory Visit: Payer: Self-pay | Admitting: Internal Medicine

## 2021-05-25 ENCOUNTER — Other Ambulatory Visit: Payer: Self-pay

## 2021-05-25 ENCOUNTER — Other Ambulatory Visit: Payer: Self-pay | Admitting: Obstetrics and Gynecology

## 2021-05-25 ENCOUNTER — Encounter: Payer: Self-pay | Admitting: Obstetrics and Gynecology

## 2021-05-25 ENCOUNTER — Ambulatory Visit (INDEPENDENT_AMBULATORY_CARE_PROVIDER_SITE_OTHER): Payer: BC Managed Care – PPO | Admitting: Obstetrics and Gynecology

## 2021-05-25 VITALS — BP 148/76 | HR 79

## 2021-05-25 DIAGNOSIS — N952 Postmenopausal atrophic vaginitis: Secondary | ICD-10-CM

## 2021-05-25 MED ORDER — ESTRADIOL 0.1 MG/GM VA CREA: TOPICAL_CREAM | VAGINAL | 11 refills | Status: DC

## 2021-05-25 NOTE — Progress Notes (Signed)
[facility] ?Return Visit ? ?SUBJECTIVE  ?History of Present Illness: ?Kristine Callahan is a 56 y.o. female seen in follow-up for bladder urgency. That has improved a lot and no longer has issues with urgency. s/p posterior repair, perineorrhaphy and sacrospinous ligament fixation on 01/21/21  ? ?Has had some discomfort with intercourse- mainly irritation and dryness. Has pulled out some sutures as well in the last few weeks.  ? ?Bowel movements are regular with the miralax daily. No longer needing the suppositories.  ? ?Past Medical History: ?Patient  has a past medical history of 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[place]) (11/17/2020), COVID-19 (12/2020), Headache, Heart murmur, systolic, Hyperlipidemia, Hypertension, Obesity, Prolapse of 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, Umbilical hernia, and Uncontrolled type 2 diabetes mellitus with hyperglycemia, with long-term current use of insulin ([place]) (11/29/2019).  ? ?Past Surgical History: ?She  has a past surgical history that includes Cholecystectomy; Colonoscopy with propofol (N/A, 05/31/2016); Rectocele repair (N/A, 01/21/2021); Anterior and posterior repair with sacrospinous fixation (N/A, 01/21/2021); Colonoscopy (10/2020); Flexible sigmoidoscopy (N/A, 03/26/2021); Hemostasis clip placement (03/26/2021); and Hot hemostasis (N/A, 03/26/2021).  ? ?Medications: ?She has a current medication list which includes the following prescription(s): accu-chek guide, acetaminophen, aspirin, accu-chek guide me, dexcom g6 sensor, dexcom g6 transmitter, dapagliflozin propanediol, docusate sodium, estradiol, fenofibrate, ibuprofen, novolog flexpen, tresiba flextouch, insulin pen needle, victoza, lisinopril-hydrochlorothiazide, melatonin, metformin, gerhardt's butt cream, nystatin-triamcinolone ointment, polyethylene glycol powder, rosuvastatin, and mesalamine.  ? ?Allergies: ?Patient is allergic to latex.  ? ?Social History: ?Patient  reports that she has never smoked. She has never used  smokeless tobacco. She reports that she does not currently use alcohol. She reports that she does not use drugs.  ?  ?  ?OBJECTIVE  ?  ? ?Physical Exam: ?Vitals:  ? 05/25/21 1604  ?BP: (!) 148/76  ?Pulse: 79  ? ?Gen: No apparent distress, A&O x 3. ? ?Detailed Urogynecologic Evaluation:  ?Normal external genitalia. Speculum exam reveals atrophy, normal appearing cervix. No sutures present, well healed.  ? ?POP-Q ? ?-2  ?                                          Aa   ?-2 ?                                          Ba  ?-6.5  ?                                            C  ? ?3.5  ?                                          Gh  ?5  ?                                          Pb  ?10  ?  tvl  ? ?-2  ?                                          Ap  ?-2  ?                                          Bp  ?-9  ?                                            D  ? ?  ? ?ASSESSMENT AND PLAN  ?  ?Ms. Chieffo is a 56 y.o. with:  ?1. Vaginal atrophy   ? ?- For symptomatic vaginal atrophy options include lubrication with a water-based lubricant, personal hygiene measures and barrier protection against wetness, and estrogen replacement in the form of vaginal cream, vaginal tablets, or a time-released vaginal ring.   ?- Prescribed estrace cream 0.5g nightly for two weeks then twice weekly after. Can also use coconut oil or silicone lubricant with intercourse.  ? ?Return as needed ? ?[name], MD ? ? ?

## 2021-05-25 NOTE — Patient Instructions (Signed)
Vulvovaginal moisturizer Options: Vitamin E oil (pump or capsule) or cream (Gene's Vit E Cream) Coconut oil Silicone-based lubricant for use during intercourse ("wet platinum" is a brand available at most drugstores) Crisco Consider the ingredients of the product - the fewer the ingredients the better!  Directions for Use: Clean and dry your hands Gently dab the vulvar/vaginal area dry as needed Apply a "pea-sized" amount of the moisturizer onto your fingertip Using you other hand, open the labia  Apply the moisturizer to the vulvar/vaginal tissues Wear loose fitting underwear/clothing if possible following application Use moisturize up to 3 times daily as desired.  

## 2021-05-31 DIAGNOSIS — H2513 Age-related nuclear cataract, bilateral: Secondary | ICD-10-CM | POA: Diagnosis not present

## 2021-05-31 DIAGNOSIS — E113313 Type 2 diabetes mellitus with moderate nonproliferative diabetic retinopathy with macular edema, bilateral: Secondary | ICD-10-CM | POA: Diagnosis not present

## 2021-07-14 ENCOUNTER — Ambulatory Visit: Payer: BC Managed Care – PPO | Admitting: Internal Medicine

## 2021-07-29 ENCOUNTER — Telehealth: Payer: Self-pay

## 2021-07-29 DIAGNOSIS — K432 Incisional hernia without obstruction or gangrene: Secondary | ICD-10-CM | POA: Diagnosis not present

## 2021-07-30 NOTE — Telephone Encounter (Signed)
Note not needed 

## 2021-08-10 DIAGNOSIS — E119 Type 2 diabetes mellitus without complications: Secondary | ICD-10-CM | POA: Diagnosis not present

## 2021-08-22 ENCOUNTER — Other Ambulatory Visit: Payer: Self-pay | Admitting: Internal Medicine

## 2021-08-31 DIAGNOSIS — I10 Essential (primary) hypertension: Secondary | ICD-10-CM | POA: Diagnosis not present

## 2021-08-31 DIAGNOSIS — E1165 Type 2 diabetes mellitus with hyperglycemia: Secondary | ICD-10-CM | POA: Diagnosis not present

## 2021-08-31 DIAGNOSIS — E785 Hyperlipidemia, unspecified: Secondary | ICD-10-CM | POA: Diagnosis not present

## 2021-08-31 DIAGNOSIS — E1139 Type 2 diabetes mellitus with other diabetic ophthalmic complication: Secondary | ICD-10-CM | POA: Diagnosis not present

## 2021-09-01 DIAGNOSIS — E113313 Type 2 diabetes mellitus with moderate nonproliferative diabetic retinopathy with macular edema, bilateral: Secondary | ICD-10-CM | POA: Diagnosis not present

## 2021-09-01 DIAGNOSIS — H2513 Age-related nuclear cataract, bilateral: Secondary | ICD-10-CM | POA: Diagnosis not present

## 2021-09-23 ENCOUNTER — Encounter: Payer: Self-pay | Admitting: Internal Medicine

## 2021-09-23 ENCOUNTER — Ambulatory Visit: Payer: BC Managed Care – PPO | Admitting: Internal Medicine

## 2021-09-23 ENCOUNTER — Ambulatory Visit (INDEPENDENT_AMBULATORY_CARE_PROVIDER_SITE_OTHER): Payer: BC Managed Care – PPO | Admitting: Internal Medicine

## 2021-09-23 VITALS — BP 120/80 | HR 78 | Ht 70.0 in | Wt 240.0 lb

## 2021-09-23 DIAGNOSIS — E113593 Type 2 diabetes mellitus with proliferative diabetic retinopathy without macular edema, bilateral: Secondary | ICD-10-CM

## 2021-09-23 DIAGNOSIS — E1142 Type 2 diabetes mellitus with diabetic polyneuropathy: Secondary | ICD-10-CM | POA: Diagnosis not present

## 2021-09-23 LAB — POCT GLYCOSYLATED HEMOGLOBIN (HGB A1C): Hemoglobin A1C: 7.1 % — AB (ref 4.0–5.6)

## 2021-09-23 MED ORDER — RYBELSUS 7 MG PO TABS: 7.0000 mg | ORAL_TABLET | Freq: Every day | ORAL | 3 refills | Status: DC

## 2021-09-23 NOTE — Progress Notes (Signed)
Name: Kristine Callahan  Age/ Sex: 56 y.o., female   MRN/ DOB: [id number], 05/15/1965     PCP: Leeroy Cha, MD   Reason for Endocrinology Evaluation: Type 2 Diabetes Mellitus  Initial Endocrine Consultative Visit: T2DM and HTN    PATIENT IDENTIFIER: Kristine Callahan is a 56 y.o. female with a past medical history of HTN, T2 DM and ulcerative proctitis. The patient has followed with Endocrinology clinic since 11/29/2019 for consultative assistance with management of her diabetes.  DIABETIC HISTORY:  Kristine Callahan was diagnosed with DM yrs ago, she has been on Glipizide, insulin started 08/2019. Her hemoglobin A1c has ranged from 7.1% , peaking at 11.0%  in 2016.  On her initial visit to our clinic she had an A1c of 7.9% .  She was on Janumet, Farxiga, Victoza and basal insulin. We stopped Janumet , started Metformin, switched Levemir tresiba and continued [place] and Victoza.   She was lost to follow-up until her return and 11/2020 with an A1c of 8.3%  SUBJECTIVE:   During the last visit (01/13/2021): A1c 7.1 % we started prandial insulin , continued metformin, farxiga and victoza    Today (09/23/2021): Kristine Callahan is here for a follow up on diabetes management.She checks her glucose 3-4 times a day,  through dexcom      She has been diagnosed with ulcerative proctitis , she is s/p rectocele repair in November 2022  She is also s/p flex sigmoidoscopy in January 2023 Rectal leakage has improved   HOME DIABETES REGIMEN:  Metformin 1000 mg Twice daily  Farxiga 10 mg daily  Victoza 1.8 mg daily  Tresiba 30 units daily Novolog 8 units with each meal  CF: NovoLog (BG -130/25)    Statin: yes ACE-I/ARB: yes Prior Diabetic Education: yes   CONTINUOUS GLUCOSE MONITORING RECORD INTERPRETATION    Dates of Recording: 6/23-09/23/2021  Sensor description:dexcom  Results statistics:   CGM use % of time 93  Average and SD 160/30  Time in range   75     %  % Time Above 180 24   % Time above 250 0  % Time Below target <1    Glycemic patterns summary: BG's optimal at night and slight hyperglycemia noted post meals   Hyperglycemic episodes  postprandial  Hypoglycemic episodes occurred n/a  Overnight periods: optimal      DIABETIC COMPLICATIONS: Microvascular complications:  Neuropathy, DR stable B/L  Denies: CKD  Last Eye Exam: Completed 08/2021  Macrovascular complications:   Denies: CAD, CVA, PVD   HISTORY:  Past Medical History:  Past Medical History:  Diagnosis Date   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[place]) 11/17/2020   COVID-19 12/2020   Headache    hx migraines none in years   Heart murmur, systolic    Negative echocardiogram 2016   Hyperlipidemia    on meds   Hypertension    on meds   Obesity    Prolapse of 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    Umbilical hernia    Uncontrolled type 2 diabetes mellitus with hyperglycemia, with long-term current use of insulin ([place]) 11/29/2019   on meds   Past Surgical History:  Past Surgical History:  Procedure Laterality Date   ANTERIOR AND POSTERIOR REPAIR WITH SACROSPINOUS FIXATION N/A 01/21/2021   Procedure: SACROSPINOUS FIXATION;  Surgeon: [name], MD;  Location: [facility];  Service: Gynecology;  Laterality: N/A;   CHOLECYSTECTOMY     COLONOSCOPY  10/2020   CG-MAC-fair prep + ulcer tics, low sphincter  tone   COLONOSCOPY WITH PROPOFOL N/A 05/31/2016   Procedure: COLONOSCOPY WITH PROPOFOL;  Surgeon: Garlan Fair, MD;  Location: [facility];  Service: Endoscopy;  Laterality: N/A;   FLEXIBLE SIGMOIDOSCOPY N/A 03/26/2021   Procedure: FLEXIBLE SIGMOIDOSCOPY;  Surgeon: Yetta Flock, MD;  Location: [facility];  Service: Gastroenterology;  Laterality: N/A;   HEMOSTASIS CLIP PLACEMENT  03/26/2021   Procedure: HEMOSTASIS CLIP PLACEMENT;  Surgeon: Yetta Flock, MD;  Location: [facility];  Service: Gastroenterology;;   HOT HEMOSTASIS N/A 03/26/2021   Procedure: HOT  HEMOSTASIS (ARGON PLASMA COAGULATION/BICAP);  Surgeon: Yetta Flock, MD;  Location: [facility];  Service: Gastroenterology;  Laterality: N/A;   RECTOCELE REPAIR N/A 01/21/2021   Procedure: POSTERIOR REPAIR (RECTOCELE) and perineorrhaphy;  Surgeon: [name], MD;  Location[facility];  Service: Gynecology;  Laterality: N/A;   Social History:  reports that she has never smoked. She has never used smokeless tobacco. She reports that she does not currently use alcohol. She reports that she does not use drugs. Family History:  Family History  Adopted: Yes  Problem Relation Age of Onset   Breast cancer Neg Hx      HOME MEDICATIONS: Allergies as of 09/23/2021       Reactions   Latex Other (See Comments)   Taste sensation         Medication List        Accurate as of September 23, 2021  9:38 AM. If you have any questions, ask your nurse or doctor.          Accu-Chek Guide Me w/Device Kit by Does not apply route.   Accu-Chek Guide test strip Generic drug: glucose blood CHECK BLOOD SUGARS 3 TIMES DAILY   acetaminophen 500 MG tablet Commonly known as: TYLENOL Take 1 tablet (500 mg total) by mouth every 6 (six) hours as needed (pain).   aspirin 81 MG tablet Take 81 mg by mouth daily.   dapagliflozin propanediol 10 MG Tabs tablet Commonly known as: FARXIGA Take 1 tablet (10 mg total) by mouth daily.   Dexcom G6 Sensor Misc 1 Device by Does not apply route as directed.   Dexcom G6 Transmitter Misc 1 Device by Does not apply route as directed.   docusate sodium 100 MG capsule Commonly known as: Colace Take 1 capsule (100 mg total) by mouth 2 (two) times daily.   estradiol 0.1 MG/GM vaginal cream Commonly known as: ESTRACE PLACE 0.5G NIGHTLY FOR TWO WEEKS THEN TWICE A WEEK AFTER   fenofibrate 160 MG tablet Take 160 mg by mouth daily.   Gerhardt's butt cream Crea Apply 2-4 x a day to perianal rash   ibuprofen 600 MG tablet Commonly  known as: ADVIL Take 1 tablet (600 mg total) by mouth every 6 (six) hours as needed.   Insulin Pen Needle 32G X 4 MM Misc 1 Device by Does not apply route in the morning, at noon, in the evening, and at bedtime.   lisinopril-hydrochlorothiazide 20-25 MG tablet Commonly known as: ZESTORETIC Take 1 tablet by mouth daily.   MELATONIN PO Take 1 tablet by mouth at bedtime.   mesalamine 1000 MG suppository Commonly known as: CANASA Place 1 suppository (1,000 mg total) rectally at bedtime.   metFORMIN 1000 MG tablet Commonly known as: GLUCOPHAGE Take 1 tablet (1,000 mg total) by mouth 2 (two) times daily with a meal.   NovoLOG FlexPen 100 UNIT/ML FlexPen Generic drug: insulin aspart Max daily 60 units   nystatin-triamcinolone  ointment Commonly known as: MYCOLOG Apply 1 application topically 2 (two) times daily.   polyethylene glycol powder 17 GM/SCOOP powder Commonly known as: GLYCOLAX/MIRALAX Take 17 g by mouth daily. Drink 17g (1 scoop) dissolved in water per day.   rosuvastatin 5 MG tablet Commonly known as: CRESTOR Take 5 mg by mouth daily.   Rybelsus 7 MG Tabs Generic drug: Semaglutide Take 7 mg by mouth daily. Started by: Dorita Sciara, MD   Tyler Aas FlexTouch 100 UNIT/ML FlexTouch Pen Generic drug: insulin degludec Inject 30 Units into the skin daily.   Victoza 18 MG/3ML Sopn Generic drug: liraglutide Inject 1.8 mg into the skin daily.         OBJECTIVE:   Vital Signs: BP 120/80 (BP Location: Left Arm, Patient Position: Sitting, Cuff Size: Large)   Pulse 78   Ht 5' 10" (1.778 m)   Wt 240 lb (108.9 kg)   LMP  (LMP Unknown)   SpO2 95%   BMI 34.44 kg/m   Wt Readings from Last 3 Encounters:  09/23/21 240 lb (108.9 kg)  05/14/21 237 lb (107.5 kg)  03/26/21 229 lb 0.9 oz (103.9 kg)     Exam: General: Pt appears well and is in NAD  Lungs: Clear with good BS bilat with no rales, rhonchi, or wheezes  Heart: RRR with normal S1 and S2 and no  gallops; no murmurs; no rub  Abdomen: Normoactive bowel sounds, soft, nontender, without masses or organomegaly palpable  Extremities: No pretibial edema. No tremor. Normal strength and motion throughout. See detailed diabetic foot exam below.  Neuro: MS is good with appropriate affect, pt is alert and Ox3    DM foot exam: 11/25/2020  The skin of the feet is intact without sores or ulcerations. The pedal pulses are 1+ on right and 1+ on left. The sensation is intact to a screening 5.07, 10 gram monofilament bilaterally          DATA REVIEWED:  Lab Results  Component Value Date   HGBA1C 7.1 (A) 09/23/2021   HGBA1C 7.1 (A) 01/13/2021   HGBA1C 8.3 (A) 11/25/2020     ASSESSMENT / PLAN / RECOMMENDATIONS:   1) Type 2 Diabetes Mellitus, Optimally controlled, With retinopathic and neuropathic  complications - Most recent A1c of 7.1 %. Goal A1c < 7.0 %.      - A1c remains at goal -We discussed switching Victoza to Rybelsus which she is in agreement of this, cautioned against GI side effects, advised the patient to take Rybelsus 30 minutes before breakfast -She was provided with 3 mg samples for 1 month to be followed by Rybelsus 7 mg daily -I am also going to increase her NovoLog based on CGM download today as she has been noted postprandial hyperglycemia  MEDICATIONS: -Stop Victoza -Start Rybelsus 3 mg daily for 1 month, then increase to 7 mg daily - Continue Metformin 1000 mg Twice daily  - Continue Farxiga 10 mg daily  - Continue Tresiba 30 units daily  -Increase Novolog 10 units with each meal  - CF: NovoLog (BG -130/25)    EDUCATION / INSTRUCTIONS: BG monitoring instructions: Patient is instructed to check her blood sugars 3 times a day, before meals. Call [facility] if: BG persistently < 70  I reviewed the Rule of 15 for the treatment of hypoglycemia in detail with the patient. Literature supplied.   2) Diabetic complications:  Eye: Does  have  known diabetic retinopathy.  Neuro/ Feet: Does not have known diabetic peripheral  neuropathy .  Renal: Patient does not have known baseline CKD. She   is on an ACEI/ARB at present.     3) Dyslipidemia: Patient is on Rosuvastatin 5 mg daily . LDL at  goal.     F/U in 6 months   Signed electronically by: Mack Guise, MD  [facility]  [facility] [facility] [address] [place], [place] [address] Phone: [phone] FAX: [phone]   CC: Leeroy Cha, [place] [place] [address] Phone: [phone]  Fax: [phone]  Return to Endocrinology clinic as below: Future Appointments  Date Time Provider [place]  02/04/2022  9:10 AM Quadre Bristol, Melanie Crazier, MD [id number] None

## 2021-09-23 NOTE — Patient Instructions (Addendum)
-   STOP Victoza - Start Rybelsus 3 mg daily for 1 month, than increase to 7 mg daily  - Continue Metformin 1000 mg Twice daily  - Continue Farxiga 10 mg daily  - Continue Tresiba  30 units daily  - Increase  Novolog 10 units with each meal  -Novolog correctional insulin: ADD extra units on insulin to your meal-time Novolog dose if your blood sugars are higher than 155. Use the scale below to help guide you:   Blood sugar before meal Number of units to inject  Less than 155 0 unit  156 -  180 1 units  181 -  205 2 units  206 -  230 3 units  231 -  255 4 units  256 -  280 5 units  281 -  305 6 units  306 -  330 7 units  331 -  355 8 units     HOW TO TREAT LOW BLOOD SUGARS (Blood sugar LESS THAN 70 MG/DL) Please follow the RULE OF 15 for the treatment of hypoglycemia treatment (when your (blood sugars are less than 70 mg/dL)   STEP 1: Take 15 grams of carbohydrates when your blood sugar is low, which includes:  3-4 GLUCOSE TABS  OR 3-4 OZ OF JUICE OR REGULAR SODA OR ONE TUBE OF GLUCOSE GEL    STEP 2: RECHECK blood sugar in 15 MINUTES STEP 3: If your blood sugar is still low at the 15 minute recheck --> then, go back to STEP 1 and treat AGAIN with another 15 grams of carbohydrates.

## 2021-11-08 DIAGNOSIS — E119 Type 2 diabetes mellitus without complications: Secondary | ICD-10-CM | POA: Diagnosis not present

## 2021-12-01 ENCOUNTER — Ambulatory Visit: Payer: BC Managed Care – PPO | Admitting: Internal Medicine

## 2021-12-31 ENCOUNTER — Other Ambulatory Visit: Payer: Self-pay | Admitting: Internal Medicine

## 2022-01-06 ENCOUNTER — Other Ambulatory Visit (HOSPITAL_COMMUNITY): Payer: Self-pay

## 2022-01-06 ENCOUNTER — Other Ambulatory Visit: Payer: Self-pay | Admitting: Internal Medicine

## 2022-01-10 ENCOUNTER — Encounter: Payer: Self-pay | Admitting: Internal Medicine

## 2022-01-10 DIAGNOSIS — H2513 Age-related nuclear cataract, bilateral: Secondary | ICD-10-CM | POA: Diagnosis not present

## 2022-01-10 DIAGNOSIS — E113313 Type 2 diabetes mellitus with moderate nonproliferative diabetic retinopathy with macular edema, bilateral: Secondary | ICD-10-CM | POA: Diagnosis not present

## 2022-02-04 ENCOUNTER — Encounter: Payer: Self-pay | Admitting: Internal Medicine

## 2022-02-04 ENCOUNTER — Ambulatory Visit (INDEPENDENT_AMBULATORY_CARE_PROVIDER_SITE_OTHER): Payer: BC Managed Care – PPO | Admitting: Internal Medicine

## 2022-02-04 VITALS — BP 126/82 | HR 87 | Ht 70.0 in | Wt 238.0 lb

## 2022-02-04 DIAGNOSIS — Z794 Long term (current) use of insulin: Secondary | ICD-10-CM

## 2022-02-04 DIAGNOSIS — E1165 Type 2 diabetes mellitus with hyperglycemia: Secondary | ICD-10-CM | POA: Diagnosis not present

## 2022-02-04 DIAGNOSIS — E113593 Type 2 diabetes mellitus with proliferative diabetic retinopathy without macular edema, bilateral: Secondary | ICD-10-CM | POA: Diagnosis not present

## 2022-02-04 DIAGNOSIS — E1142 Type 2 diabetes mellitus with diabetic polyneuropathy: Secondary | ICD-10-CM

## 2022-02-04 LAB — POCT GLYCOSYLATED HEMOGLOBIN (HGB A1C): Hemoglobin A1C: 8.1 % — AB (ref 4.0–5.6)

## 2022-02-04 MED ORDER — TRESIBA FLEXTOUCH 100 UNIT/ML ~~LOC~~ SOPN: 30.0000 [IU] | PEN_INJECTOR | Freq: Every day | SUBCUTANEOUS | 3 refills | Status: DC

## 2022-02-04 MED ORDER — DEXCOM G7 SENSOR MISC: 1.0000 | 3 refills | Status: DC

## 2022-02-04 MED ORDER — RYBELSUS 14 MG PO TABS: 14.0000 mg | ORAL_TABLET | Freq: Every day | ORAL | 3 refills | Status: DC

## 2022-02-04 MED ORDER — INSULIN PEN NEEDLE 32G X 4 MM MISC: 1.0000 | Freq: Four times a day (QID) | 2 refills | Status: DC

## 2022-02-04 MED ORDER — NOVOLOG FLEXPEN 100 UNIT/ML ~~LOC~~ SOPN: PEN_INJECTOR | SUBCUTANEOUS | 3 refills | Status: DC

## 2022-02-04 MED ORDER — DAPAGLIFLOZIN PROPANEDIOL 10 MG PO TABS
10.0000 mg | ORAL_TABLET | Freq: Every day | ORAL | 0 refills | Status: DC
Start: 2022-02-04 — End: 2022-07-25

## 2022-02-04 MED ORDER — METFORMIN HCL 1000 MG PO TABS
1000.0000 mg | ORAL_TABLET | Freq: Two times a day (BID) | ORAL | 3 refills | Status: DC
Start: 2022-02-04 — End: 2022-07-25

## 2022-02-04 NOTE — Progress Notes (Signed)
Name: Kristine Callahan  Age/ Sex: 56 y.o., female   MRN/ DOB: [id number], 1965/09/27     PCP: Leeroy Cha, MD   Reason for Endocrinology Evaluation: Type 2 Diabetes Mellitus  Initial Endocrine Consultative Visit: T2DM and HTN    PATIENT IDENTIFIER: Kristine Callahan is a 56 y.o. female with a past medical history of HTN, T2 DM and ulcerative proctitis. The patient has followed with Endocrinology clinic since 11/29/2019 for consultative assistance with management of her diabetes.  DIABETIC HISTORY:  Ms. Holtzclaw was diagnosed with DM yrs ago, she has been on Glipizide, insulin started 08/2019. Her hemoglobin A1c has ranged from 7.1% , peaking at 11.0%  in 2016.  On her initial visit to our clinic she had an A1c of 7.9% .  She was on Janumet, Farxiga, Victoza and basal insulin. We stopped Janumet , started Metformin, switched Levemir tresiba and continued [place] and Victoza.   She was lost to follow-up until her return and 11/2020 with an A1c of 8.3%   By July 2023 we switched Victoza to Rybelsus  SUBJECTIVE:   During the last visit (09/23/2021/2022): A1c 7.1 %   Today (02/04/2022): Kristine Callahan is here for a follow up on diabetes management.She checks her glucose 3-4 times a day,  through dexcom     Unfortunately she lost her father in -law  last night  She has had a tough summer with stress  She has been diagnosed with ulcerative proctitis , she is s/p rectocele repair in November 2022   Rectal leakage stable  Denies nausea, or vomiting   HOME DIABETES REGIMEN:  Metformin 1000 mg Twice daily  Farxiga 10 mg daily  Rybelsus 7 mg daily  Tresiba 30 units daily Novolog 10 units with each meal  CF: NovoLog (BG -130/25)    Statin: yes ACE-I/ARB: yes Prior Diabetic Education: yes   CONTINUOUS GLUCOSE MONITORING RECORD INTERPRETATION    Dates of Recording: 11/4-11/17/2023  Sensor description:dexcom  Results statistics:   CGM use % of time 93  Average and SD  174/35  Time in range  61    %  % Time Above 180 37  % Time above 250 2  % Time Below target 0    Glycemic patterns summary: BG's optimal at night and hyperglycemia noted post meals   Hyperglycemic episodes  postprandial  Hypoglycemic episodes occurred n/a  Overnight periods: optimal      DIABETIC COMPLICATIONS: Microvascular complications:  Neuropathy, DR stable B/L  Denies: CKD  Last Eye Exam: Completed 08/2021  Macrovascular complications:   Denies: CAD, CVA, PVD   HISTORY:  Past Medical History:  Past Medical History:  Diagnosis Date   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[place]) 11/17/2020   COVID-19 12/2020   Headache    hx migraines none in years   Heart murmur, systolic    Negative echocardiogram 2016   Hyperlipidemia    on meds   Hypertension    on meds   Obesity    Prolapse of 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    Umbilical hernia    Uncontrolled type 2 diabetes mellitus with hyperglycemia, with long-term current use of insulin ([place]) 11/29/2019   on meds   Past Surgical History:  Past Surgical History:  Procedure Laterality Date   ANTERIOR AND POSTERIOR REPAIR WITH SACROSPINOUS FIXATION N/A 01/21/2021   Procedure: SACROSPINOUS FIXATION;  Surgeon: [name], MD;  Location: [facility];  Service: Gynecology;  Laterality: N/A;   CHOLECYSTECTOMY     COLONOSCOPY  10/2020   CG-MAC-fair prep + ulcer tics, low sphincter tone   COLONOSCOPY WITH PROPOFOL N/A 05/31/2016   Procedure: COLONOSCOPY WITH PROPOFOL;  Surgeon: Garlan Fair, MD;  Location: [facility];  Service: Endoscopy;  Laterality: N/A;   FLEXIBLE SIGMOIDOSCOPY N/A 03/26/2021   Procedure: FLEXIBLE SIGMOIDOSCOPY;  Surgeon: Yetta Flock, MD;  Location: [facility];  Service: Gastroenterology;  Laterality: N/A;   HEMOSTASIS CLIP PLACEMENT  03/26/2021   Procedure: HEMOSTASIS CLIP PLACEMENT;  Surgeon: Yetta Flock, MD;  Location: [facility];  Service: Gastroenterology;;   HOT  HEMOSTASIS N/A 03/26/2021   Procedure: HOT HEMOSTASIS (ARGON PLASMA COAGULATION/BICAP);  Surgeon: Yetta Flock, MD;  Location: [facility];  Service: Gastroenterology;  Laterality: N/A;   RECTOCELE REPAIR N/A 01/21/2021   Procedure: POSTERIOR REPAIR (RECTOCELE) and perineorrhaphy;  Surgeon: [name], MD;  Location: [facility];  Service: Gynecology;  Laterality: N/A;   Social History:  reports that she has never smoked. She has never used smokeless tobacco. She reports that she does not currently use alcohol. She reports that she does not use drugs. Family History:  Family History  Adopted: Yes  Problem Relation Age of Onset   Breast cancer Neg Hx      HOME MEDICATIONS: Allergies as of 02/04/2022       Reactions   Latex Other (See Comments)   Taste sensation         Medication List        Accurate as of February 04, 2022  9:28 AM. If you have any questions, ask your nurse or doctor.          Accu-Chek Guide Me w/Device Kit by Does not apply route.   Accu-Chek Guide test strip Generic drug: glucose blood CHECK BLOOD SUGARS 3 TIMES DAILY   acetaminophen 500 MG tablet Commonly known as: TYLENOL Take 1 tablet (500 mg total) by mouth every 6 (six) hours as needed (pain).   aspirin 81 MG tablet Take 81 mg by mouth daily.   Dexcom G6 Sensor Misc 1 Device by Does not apply route as directed.   Dexcom G6 Transmitter Misc 1 Device by Does not apply route as directed.   docusate sodium 100 MG capsule Commonly known as: Colace Take 1 capsule (100 mg total) by mouth 2 (two) times daily.   estradiol 0.1 MG/GM vaginal cream Commonly known as: ESTRACE PLACE 0.5G NIGHTLY FOR TWO WEEKS THEN TWICE A WEEK AFTER   Farxiga 10 MG Tabs tablet Generic drug: dapagliflozin propanediol TAKE 1 TABLET BY MOUTH EVERY DAY   fenofibrate 160 MG tablet Take 160 mg by mouth daily.   Gerhardt's butt cream Crea Apply 2-4 x a day to perianal rash    ibuprofen 600 MG tablet Commonly known as: ADVIL Take 1 tablet (600 mg total) by mouth every 6 (six) hours as needed.   Insulin Pen Needle 32G X 4 MM Misc 1 Device by Does not apply route in the morning, at noon, in the evening, and at bedtime.   lisinopril-hydrochlorothiazide 20-25 MG tablet Commonly known as: ZESTORETIC Take 1 tablet by mouth daily.   MELATONIN PO Take 1 tablet by mouth at bedtime.   mesalamine 1000 MG suppository Commonly known as: CANASA Place 1 suppository (1,000 mg total) rectally at bedtime.   metFORMIN 1000 MG tablet Commonly known as: GLUCOPHAGE TAKE 1 TABLET BY MOUTH 2 TIMES DAILY WITH A MEAL.   NovoLOG FlexPen 100 UNIT/ML FlexPen Generic drug: insulin aspart MAX DAILY 60 UNITS  nystatin-triamcinolone ointment Commonly known as: MYCOLOG Apply 1 application topically 2 (two) times daily.   polyethylene glycol powder 17 GM/SCOOP powder Commonly known as: GLYCOLAX/MIRALAX Take 17 g by mouth daily. Drink 17g (1 scoop) dissolved in water per day.   rosuvastatin 5 MG tablet Commonly known as: CRESTOR Take 5 mg by mouth daily.   Rybelsus 7 MG Tabs Generic drug: Semaglutide Take 7 mg by mouth daily.   Tyler Aas FlexTouch 100 UNIT/ML FlexTouch Pen Generic drug: insulin degludec Inject 30 Units into the skin daily.         OBJECTIVE:   Vital Signs: BP 126/82 (BP Location: Left Arm, Patient Position: Sitting, Cuff Size: Large)   Pulse 87   Ht _0  (1.778 m)   Wt 238 lb (108 kg)   LMP  (LMP Unknown)   SpO2 97%   BMI 34.15 kg/m   Wt Readings from Last 3 Encounters:  02/04/22 238 lb (108 kg)  09/23/21 240 lb (108.9 kg)  05/14/21 237 lb (107.5 kg)     Exam: General: Pt appears well and is in NAD  Lungs: Clear with good BS bilat with no rales, rhonchi, or wheezes  Heart: RRR with normal S1 and S2 and no gallops; no murmurs; no rub  Abdomen: Normoactive bowel sounds, soft, nontender, without masses or organomegaly palpable   Extremities: No pretibial edema. No tremor. Normal strength and motion throughout. See detailed diabetic foot exam below.  Neuro: MS is good with appropriate affect, pt is alert and Ox3    DM foot exam: 11/25/2020  The skin of the feet is intact without sores or ulcerations. The pedal pulses are 1+ on right and 1+ on left. The sensation is intact to a screening 5.07, 10 gram monofilament bilaterally          DATA REVIEWED:  Lab Results  Component Value Date   HGBA1C 8.1 (A) 02/04/2022   HGBA1C 7.1 (A) 09/23/2021   HGBA1C 7.1 (A) 01/13/2021     ASSESSMENT / PLAN / RECOMMENDATIONS:   1) Type 2 Diabetes Mellitus, Sub- Optimally controlled, With retinopathic and neuropathic  complications - Most recent A1c of 8.1%. Goal A1c < 7.0 %.      - A1c has trended up , pt has been stressed out over the summer, she is a stress eater and recently and unfortunately lost her father in-law to agressive Lymphoma  -Will increase Rybelsus as below  - She was provided with a sensor sample of dexcom G7 and a new presciption [facility]   MEDICATIONS:  -Increase Rybelsus 3 mg daily for 1 month, then increase to 7 mg daily - Continue Metformin 1000 mg Twice daily  - Continue Farxiga 10 mg daily  - Continue Tresiba 30 units daily  - Continue  Novolog 10 units with each meal  - CF: NovoLog (BG -130/25)    EDUCATION / INSTRUCTIONS: BG monitoring instructions: Patient is instructed to check her blood sugars 3 times a day, before meals. Call [facility] if: BG persistently < 70  I reviewed the Rule of 15 for the treatment of hypoglycemia in detail with the patient. Literature supplied.   2) Diabetic complications:  Eye: Does  have known diabetic retinopathy.  Neuro/ Feet: Does not have known diabetic peripheral neuropathy .  Renal: Patient does not have known baseline CKD. She   is on an ACEI/ARB at present.     3) Dyslipidemia: Patient is on  Rosuvastatin 5 mg daily .  LDL at  goal.     F/U in 6 months   Signed electronically by: Mack Guise, MD  [facility]  [facility] [address] [place], [place] [address] Phone: [phone] FAX: [phone]   CC: Leeroy Cha, [place] [place] [address] Phone: [phone]  Fax: [phone]  Return to Endocrinology clinic as below: No future appointments.

## 2022-02-04 NOTE — Patient Instructions (Addendum)
-   Increase Rybelsus 14 mg daily  - Continue Metformin 1000 mg Twice daily  - Continue Farxiga 10 mg daily  - Continue Tresiba  30 units daily  - Increase  Novolog 10 units with each meal  -Novolog correctional insulin: ADD extra units on insulin to your meal-time Novolog dose if your blood sugars are higher than 155. Use the scale below to help guide you:   Blood sugar before meal Number of units to inject  Less than 155 0 unit  156 -  180 1 units  181 -  205 2 units  206 -  230 3 units  231 -  255 4 units  256 -  280 5 units  281 -  305 6 units  306 -  330 7 units  331 -  355 8 units     HOW TO TREAT LOW BLOOD SUGARS (Blood sugar LESS THAN 70 MG/DL) Please follow the RULE OF 15 for the treatment of hypoglycemia treatment (when your (blood sugars are less than 70 mg/dL)   STEP 1: Take 15 grams of carbohydrates when your blood sugar is low, which includes:  3-4 GLUCOSE TABS  OR 3-4 OZ OF JUICE OR REGULAR SODA OR ONE TUBE OF GLUCOSE GEL    STEP 2: RECHECK blood sugar in 15 MINUTES STEP 3: If your blood sugar is still low at the 15 minute recheck --> then, go back to STEP 1 and treat AGAIN with another 15 grams of carbohydrates.

## 2022-02-06 DIAGNOSIS — E119 Type 2 diabetes mellitus without complications: Secondary | ICD-10-CM | POA: Diagnosis not present

## 2022-02-09 ENCOUNTER — Other Ambulatory Visit (HOSPITAL_COMMUNITY): Payer: Self-pay

## 2022-04-11 DIAGNOSIS — H2513 Age-related nuclear cataract, bilateral: Secondary | ICD-10-CM | POA: Diagnosis not present

## 2022-04-11 DIAGNOSIS — H35033 Hypertensive retinopathy, bilateral: Secondary | ICD-10-CM | POA: Diagnosis not present

## 2022-04-11 DIAGNOSIS — E113313 Type 2 diabetes mellitus with moderate nonproliferative diabetic retinopathy with macular edema, bilateral: Secondary | ICD-10-CM | POA: Diagnosis not present

## 2022-04-25 DIAGNOSIS — N900 Mild vulvar dysplasia: Secondary | ICD-10-CM | POA: Diagnosis not present

## 2022-04-25 DIAGNOSIS — N9089 Other specified noninflammatory disorders of vulva and perineum: Secondary | ICD-10-CM | POA: Diagnosis not present

## 2022-05-18 DIAGNOSIS — E119 Type 2 diabetes mellitus without complications: Secondary | ICD-10-CM | POA: Diagnosis not present

## 2022-05-27 ENCOUNTER — Other Ambulatory Visit: Payer: Self-pay | Admitting: Internal Medicine

## 2022-06-17 DIAGNOSIS — E119 Type 2 diabetes mellitus without complications: Secondary | ICD-10-CM | POA: Diagnosis not present

## 2022-07-17 DIAGNOSIS — E119 Type 2 diabetes mellitus without complications: Secondary | ICD-10-CM | POA: Diagnosis not present

## 2022-07-21 DIAGNOSIS — N900 Mild vulvar dysplasia: Secondary | ICD-10-CM | POA: Diagnosis not present

## 2022-07-22 ENCOUNTER — Other Ambulatory Visit: Payer: Self-pay | Admitting: Internal Medicine

## 2022-07-25 ENCOUNTER — Ambulatory Visit (INDEPENDENT_AMBULATORY_CARE_PROVIDER_SITE_OTHER): Payer: BC Managed Care – PPO | Admitting: Internal Medicine

## 2022-07-25 ENCOUNTER — Encounter: Payer: Self-pay | Admitting: Internal Medicine

## 2022-07-25 VITALS — BP 122/80 | HR 77 | Ht 70.0 in | Wt 235.0 lb

## 2022-07-25 DIAGNOSIS — Z794 Long term (current) use of insulin: Secondary | ICD-10-CM

## 2022-07-25 DIAGNOSIS — E785 Hyperlipidemia, unspecified: Secondary | ICD-10-CM

## 2022-07-25 DIAGNOSIS — E1142 Type 2 diabetes mellitus with diabetic polyneuropathy: Secondary | ICD-10-CM | POA: Diagnosis not present

## 2022-07-25 DIAGNOSIS — Z7984 Long term (current) use of oral hypoglycemic drugs: Secondary | ICD-10-CM | POA: Diagnosis not present

## 2022-07-25 DIAGNOSIS — E113593 Type 2 diabetes mellitus with proliferative diabetic retinopathy without macular edema, bilateral: Secondary | ICD-10-CM

## 2022-07-25 LAB — BASIC METABOLIC PANEL
BUN: 18 mg/dL (ref 6–23)
CO2: 24 mEq/L (ref 19–32)
Calcium: 9.6 mg/dL (ref 8.4–10.5)
Chloride: 100 mEq/L (ref 96–112)
Creatinine, Ser: 0.74 mg/dL (ref 0.40–1.20)
GFR: 89.96 mL/min (ref >60.00)
Glucose, Bld: 149 mg/dL — ABNORMAL HIGH (ref 70–99)
Potassium: 3.9 mEq/L (ref 3.5–5.1)
Sodium: 137 mEq/L (ref 135–145)

## 2022-07-25 LAB — LDL CHOLESTEROL, DIRECT: Direct LDL: 83 mg/dL

## 2022-07-25 LAB — LIPID PANEL
Cholesterol: 130 mg/dL (ref 0–200)
HDL: 24 mg/dL — ABNORMAL LOW (ref >39.00)
NonHDL: 105.79
Total CHOL/HDL Ratio: 5
Triglycerides: 305 mg/dL — ABNORMAL HIGH (ref 0.0–149.0)
VLDL: 61 mg/dL — ABNORMAL HIGH (ref 0.0–40.0)

## 2022-07-25 LAB — MICROALBUMIN / CREATININE URINE RATIO
Creatinine,U: 44.4 mg/dL
Microalb Creat Ratio: 9.9 mg/g (ref 0.0–30.0)
Microalb, Ur: 4.4 mg/dL — ABNORMAL HIGH (ref 0.0–1.9)

## 2022-07-25 LAB — POCT GLYCOSYLATED HEMOGLOBIN (HGB A1C): Hemoglobin A1C: 7 % — AB (ref 4.0–5.6)

## 2022-07-25 MED ORDER — NOVOLOG FLEXPEN 100 UNIT/ML ~~LOC~~ SOPN: PEN_INJECTOR | SUBCUTANEOUS | 3 refills | Status: DC

## 2022-07-25 MED ORDER — METFORMIN HCL 1000 MG PO TABS: 1000.0000 mg | ORAL_TABLET | Freq: Two times a day (BID) | ORAL | 3 refills | Status: DC

## 2022-07-25 MED ORDER — DAPAGLIFLOZIN PROPANEDIOL 10 MG PO TABS: 10.0000 mg | ORAL_TABLET | Freq: Every day | ORAL | 3 refills | Status: DC

## 2022-07-25 MED ORDER — INSULIN PEN NEEDLE 32G X 4 MM MISC: 1.0000 | Freq: Four times a day (QID) | 2 refills | Status: DC

## 2022-07-25 MED ORDER — RYBELSUS 14 MG PO TABS: 14.0000 mg | ORAL_TABLET | Freq: Every day | ORAL | 3 refills | Status: DC

## 2022-07-25 MED ORDER — TRESIBA FLEXTOUCH 100 UNIT/ML ~~LOC~~ SOPN: 26.0000 [IU] | PEN_INJECTOR | Freq: Every day | SUBCUTANEOUS | 3 refills | Status: DC

## 2022-07-25 NOTE — Patient Instructions (Addendum)
-   Continue Rybelsus 14 mg daily  - Continue Metformin 1000 mg Twice daily  - Continue Farxiga 10 mg daily  - Decrease  Tresiba  26 units daily  - Continue Novolog 10 units with each meal  -Novolog correctional insulin: ADD extra units on insulin to your meal-time Novolog dose if your blood sugars are higher than 155. Use the scale below to help guide you:   Blood sugar before meal Number of units to inject  Less than 155 0 unit  156 -  180 1 units  181 -  205 2 units  206 -  230 3 units  231 -  255 4 units  256 -  280 5 units  281 -  305 6 units  306 -  330 7 units  331 -  355 8 units     HOW TO TREAT LOW BLOOD SUGARS (Blood sugar LESS THAN 70 MG/DL) Please follow the RULE OF 15 for the treatment of hypoglycemia treatment (when your (blood sugars are less than 70 mg/dL)   STEP 1: Take 15 grams of carbohydrates when your blood sugar is low, which includes:  3-4 GLUCOSE TABS  OR 3-4 OZ OF JUICE OR REGULAR SODA OR ONE TUBE OF GLUCOSE GEL    STEP 2: RECHECK blood sugar in 15 MINUTES STEP 3: If your blood sugar is still low at the 15 minute recheck --> then, go back to STEP 1 and treat AGAIN with another 15 grams of carbohydrates.

## 2022-07-25 NOTE — Progress Notes (Unsigned)
Name: Kristine Callahan  Age/ Sex: 57 y.o., female   MRN/ DOB: [id number], 16-Sep-1965     PCP: Lorenda Ishihara, MD   Reason for Endocrinology Evaluation: Type 2 Diabetes Mellitus  Initial Endocrine Consultative Visit: T2DM and HTN    PATIENT IDENTIFIER: [name] is a 57 y.o. female with a past medical history of HTN, T2 DM and ulcerative proctitis. The patient has followed with Endocrinology clinic since 11/29/2019 for consultative assistance with management of her diabetes.  DIABETIC HISTORY:  Ms. Welton was diagnosed with DM yrs ago, she has been on Glipizide, insulin started 08/2019. Her hemoglobin A1c has ranged from 7.1% , peaking at 11.0%  in 2016.  On her initial visit to our clinic she had an A1c of 7.9% .  She was on Janumet, Farxiga, Victoza and basal insulin. We stopped Janumet , started Metformin, switched Levemir tresiba and continued [place] and Victoza.   She was lost to follow-up until her return and 11/2020 with an A1c of 8.3%   By July 2023 we switched Victoza to Rybelsus  SUBJECTIVE:   During the last visit (02/04/2022): A1c 8.1 %   Today (07/25/2022): Ms. Prevette is here for a follow up on diabetes management.She checks her glucose 3-4 times a day,  through dexcom     She has been diagnosed with ulcerative proctitis , she is s/p rectocele repair in November 2022  Weight has bene stable  Bowel movement stables with constipation , stable rectal leak Had viral gastritis ~1 month ago   Has been off Rybelsus 4 days   HOME DIABETES REGIMEN:  Metformin 1000 mg Twice daily  Farxiga 10 mg daily  Rybelsus 14 mg daily  Tresiba 30 units daily Novolog 10 units with each meal  CF: NovoLog (BG -130/25)    Statin: yes ACE-I/ARB: yes Prior Diabetic Education: yes   CONTINUOUS GLUCOSE MONITORING RECORD INTERPRETATION    Dates of Recording: 4/23-07/25/2022  Sensor description:dexcom  Results statistics:   CGM use % of time 93  Average and SD 160/37   Time in range 70 %  % Time Above 180 28  % Time above 250 1  % Time Below target <1    Glycemic patterns summary: BG's optimal at night but fluctuate during the day   Hyperglycemic episodes  postprandial  Hypoglycemic episodes occurred at night   Overnight periods: trend down      DIABETIC COMPLICATIONS: Microvascular complications:  Neuropathy, DR stable B/L  Denies: CKD  Last Eye Exam: Completed 08/2021, scheduled 7/25  Macrovascular complications:   Denies: CAD, CVA, PVD   HISTORY:  Past Medical History:  Past Medical History:  Diagnosis Date   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   COVID-19 12/2020   Headache    hx migraines none in years   Heart murmur, systolic    Negative echocardiogram 2016   Hyperlipidemia    on meds   Hypertension    on meds   Obesity    Prolapse of 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    Umbilical hernia    Uncontrolled type 2 diabetes mellitus with hyperglycemia, with long-term current use of insulin ([facility]) 11/29/2019   on meds   Past Surgical History:  Past Surgical History:  Procedure Laterality Date   ANTERIOR AND POSTERIOR REPAIR WITH SACROSPINOUS FIXATION N/A 01/21/2021   Procedure: SACROSPINOUS FIXATION;  Surgeon: Marguerita Beards, MD;  Location: [facility];  Service: Gynecology;  Laterality: N/A;   CHOLECYSTECTOMY     COLONOSCOPY  10/2020   CG-MAC-fair prep + ulcer tics, low sphincter tone   COLONOSCOPY WITH PROPOFOL N/A 05/31/2016   Procedure: COLONOSCOPY WITH PROPOFOL;  Surgeon: Charolett Bumpers, MD;  Location: [facility];  Service: Endoscopy;  Laterality: N/A;   FLEXIBLE SIGMOIDOSCOPY N/A 03/26/2021   Procedure: FLEXIBLE SIGMOIDOSCOPY;  Surgeon: Benancio Deeds, MD;  Location: [facility];  Service: Gastroenterology;  Laterality: N/A;   HEMOSTASIS CLIP PLACEMENT  03/26/2021   Procedure: HEMOSTASIS CLIP PLACEMENT;  Surgeon: Benancio Deeds, MD;  Location: [facility];  Service:  Gastroenterology;;   HOT HEMOSTASIS N/A 03/26/2021   Procedure: HOT HEMOSTASIS (ARGON PLASMA COAGULATION/BICAP);  Surgeon: Benancio Deeds, MD;  Location: [facility];  Service: Gastroenterology;  Laterality: N/A;   RECTOCELE REPAIR N/A 01/21/2021   Procedure: POSTERIOR REPAIR (RECTOCELE) and perineorrhaphy;  Surgeon: Marguerita Beards, MD;  Location: [facility];  Service: Gynecology;  Laterality: N/A;   Social History:  reports that she has never smoked. She has never used smokeless tobacco. She reports that she does not currently use alcohol. She reports that she does not use drugs. Family History:  Family History  Adopted: Yes  Problem Relation Age of Onset   Breast cancer Neg Hx      HOME MEDICATIONS: Allergies as of 07/25/2022       Reactions   Latex Other (See Comments)   Taste sensation         Medication List        Accurate as of Jul 25, 2022  9:45 AM. If you have any questions, ask your nurse or doctor.          STOP taking these medications    acetaminophen 500 MG tablet Commonly known as: TYLENOL Stopped by: Scarlette Shorts, MD   Gerhardt's butt cream Crea Stopped by: Scarlette Shorts, MD   ibuprofen 600 MG tablet Commonly known as: ADVIL Stopped by: Scarlette Shorts, MD   nystatin-triamcinolone ointment Commonly known as: MYCOLOG Stopped by: Scarlette Shorts, MD       TAKE these medications    Accu-Chek Guide Me w/Device Kit by Does not apply route.   Accu-Chek Guide test strip Generic drug: glucose blood CHECK BLOOD SUGARS 3 TIMES DAILY   aspirin 81 MG tablet Take 81 mg by mouth daily.   Dexcom G7 Sensor Misc 1 Device by Does not apply route as directed.   docusate sodium 100 MG capsule Commonly known as: Colace Take 1 capsule (100 mg total) by mouth 2 (two) times daily.   estradiol 0.1 MG/GM vaginal cream Commonly known as: ESTRACE PLACE 0.5G NIGHTLY FOR TWO WEEKS THEN TWICE A WEEK AFTER    Farxiga 10 MG Tabs tablet Generic drug: dapagliflozin propanediol TAKE 1 TABLET BY MOUTH EVERY DAY   fenofibrate 160 MG tablet Take 160 mg by mouth daily.   Insulin Pen Needle 32G X 4 MM Misc 1 Device by Does not apply route in the morning, at noon, in the evening, and at bedtime.   lisinopril-hydrochlorothiazide 20-25 MG tablet Commonly known as: ZESTORETIC Take 1 tablet by mouth daily.   MELATONIN PO Take 1 tablet by mouth at bedtime.   mesalamine 1000 MG suppository Commonly known as: CANASA Place 1 suppository (1,000 mg total) rectally at bedtime.   metFORMIN 1000 MG tablet Commonly known as: GLUCOPHAGE Take 1 tablet (1,000 mg total) by mouth 2 (two) times daily with a meal.   NovoLOG FlexPen 100 UNIT/ML FlexPen Generic drug: insulin aspart Max daily 60  units   polyethylene glycol powder 17 GM/SCOOP powder Commonly known as: GLYCOLAX/MIRALAX Take 17 g by mouth daily. Drink 17g (1 scoop) dissolved in water per day.   rosuvastatin 5 MG tablet Commonly known as: CRESTOR Take 5 mg by mouth daily.   Rybelsus 14 MG Tabs Generic drug: Semaglutide Take 1 tablet (14 mg total) by mouth daily.   Evaristo Bury FlexTouch 100 UNIT/ML FlexTouch Pen Generic drug: insulin degludec Inject 30 Units into the skin daily.         OBJECTIVE:   Vital Signs: BP 122/80 (BP Location: Left Arm, Patient Position: Sitting, Cuff Size: Large)   Pulse 77   Ht 5\' 10"  (1.778 m)   Wt 235 lb (106.6 kg)   LMP  (LMP Unknown)   SpO2 96%   BMI 33.72 kg/m   Wt Readings from Last 3 Encounters:  07/25/22 235 lb (106.6 kg)  02/04/22 238 lb (108 kg)  09/23/21 240 lb (108.9 kg)     Exam: General: Pt appears well and is in NAD  Lungs: Clear with good BS bilat with no rales, rhonchi, or wheezes  Heart: RRR   Abdomen: soft, nontender  Extremities: No pretibial edema. No tremor. Normal strength and motion throughout. See detailed diabetic foot exam below.  Neuro: MS is good with appropriate  affect, pt is alert and Ox3    DM foot exam:07/25/2022  The skin of the feet is intact without sores or ulcerations. The pedal pulses are 1+ on right and 1+ on left. The sensation is intact to a screening 5.07, 10 gram monofilament bilaterally          DATA REVIEWED:  Lab Results  Component Value Date   HGBA1C 7.0 (A) 07/25/2022   HGBA1C 8.1 (A) 02/04/2022   HGBA1C 7.1 (A) 09/23/2021     ASSESSMENT / PLAN / RECOMMENDATIONS:   1) Type 2 Diabetes Mellitus, Sub- Optimally controlled, With retinopathic and neuropathic  complications - Most recent A1c of 8.1%. Goal A1c < 7.0 %.      - A1c has trended up , pt has been stressed out over the summer, she is a stress eater and recently and unfortunately lost her father in-law to agressive Lymphoma  -Will increase Rybelsus as below  - She was provided with a sensor sample of dexcom G7 and a new presciption was [facility]   MEDICATIONS:  -Increase Rybelsus 3 mg daily for 1 month, then increase to 7 mg daily - Continue Metformin 1000 mg Twice daily  - Continue Farxiga 10 mg daily  - Continue Tresiba 30 units daily  - Continue  Novolog 10 units with each meal  - CF: NovoLog (BG -130/25)    EDUCATION / INSTRUCTIONS: BG monitoring instructions: Patient is instructed to check her blood sugars 3 times a day, before meals. Call [place] [facility] if: BG persistently < 70  I reviewed the Rule of 15 for the treatment of hypoglycemia in detail with the patient. Literature supplied.   2) Diabetic complications:  Eye: Does  have known diabetic retinopathy.  Neuro/ Feet: Does not have known diabetic peripheral neuropathy .  Renal: Patient does not have known baseline CKD. She   is on an ACEI/ARB at present.     3) Dyslipidemia: Patient is on Rosuvastatin 5 mg daily . LDL at  goal.     F/U in 6 months   Signed electronically by: Lyndle Herrlich, MD  [facility]  [facility]  [address], [address] [place], [place] [address] Phone: [phone] FAX: [phone]   CC: Lorenda Ishihara, MD [address] [place] [address] Phone: [phone]  Fax: [phone]  Return to Endocrinology clinic as below: Future Appointments  Date Time Provider Department Center  07/25/2022  9:50 AM Minnie Shi, Konrad Dolores, MD [id number] None

## 2022-07-26 MED ORDER — ROSUVASTATIN CALCIUM 10 MG PO TABS
10.0000 mg | ORAL_TABLET | Freq: Every day | ORAL | 3 refills | Status: DC
Start: 2022-07-26 — End: 2023-05-30

## 2022-07-27 DIAGNOSIS — M7989 Other specified soft tissue disorders: Secondary | ICD-10-CM | POA: Diagnosis not present

## 2022-07-27 DIAGNOSIS — W57XXXA Bitten or stung by nonvenomous insect and other nonvenomous arthropods, initial encounter: Secondary | ICD-10-CM | POA: Diagnosis not present

## 2022-08-30 DIAGNOSIS — N9089 Other specified noninflammatory disorders of vulva and perineum: Secondary | ICD-10-CM | POA: Diagnosis not present

## 2022-08-30 DIAGNOSIS — N900 Mild vulvar dysplasia: Secondary | ICD-10-CM | POA: Diagnosis not present

## 2022-09-03 DIAGNOSIS — E119 Type 2 diabetes mellitus without complications: Secondary | ICD-10-CM | POA: Diagnosis not present

## 2022-09-13 DIAGNOSIS — N900 Mild vulvar dysplasia: Secondary | ICD-10-CM | POA: Diagnosis not present

## 2022-09-13 DIAGNOSIS — Z124 Encounter for screening for malignant neoplasm of cervix: Secondary | ICD-10-CM | POA: Diagnosis not present

## 2022-09-13 DIAGNOSIS — N9089 Other specified noninflammatory disorders of vulva and perineum: Secondary | ICD-10-CM | POA: Diagnosis not present

## 2022-10-13 DIAGNOSIS — H35033 Hypertensive retinopathy, bilateral: Secondary | ICD-10-CM | POA: Diagnosis not present

## 2022-10-13 DIAGNOSIS — H2511 Age-related nuclear cataract, right eye: Secondary | ICD-10-CM | POA: Diagnosis not present

## 2022-10-13 DIAGNOSIS — E113313 Type 2 diabetes mellitus with moderate nonproliferative diabetic retinopathy with macular edema, bilateral: Secondary | ICD-10-CM | POA: Diagnosis not present

## 2022-10-13 DIAGNOSIS — H25812 Combined forms of age-related cataract, left eye: Secondary | ICD-10-CM | POA: Diagnosis not present

## 2022-10-17 ENCOUNTER — Other Ambulatory Visit: Payer: Self-pay | Admitting: Internal Medicine

## 2022-10-17 DIAGNOSIS — Z1231 Encounter for screening mammogram for malignant neoplasm of breast: Secondary | ICD-10-CM

## 2022-10-18 DIAGNOSIS — E119 Type 2 diabetes mellitus without complications: Secondary | ICD-10-CM | POA: Diagnosis not present

## 2022-10-26 DIAGNOSIS — N9089 Other specified noninflammatory disorders of vulva and perineum: Secondary | ICD-10-CM | POA: Diagnosis not present

## 2022-10-26 DIAGNOSIS — A630 Anogenital (venereal) warts: Secondary | ICD-10-CM | POA: Diagnosis not present

## 2022-10-27 ENCOUNTER — Ambulatory Visit
Admission: RE | Admit: 2022-10-27 | Discharge: 2022-10-27 | Disposition: A | Discharge status: Home / Self Care | Payer: BC Managed Care – PPO | Source: Ambulatory Visit | Attending: Internal Medicine | Admitting: Internal Medicine

## 2022-10-27 DIAGNOSIS — Z1231 Encounter for screening mammogram for malignant neoplasm of breast: Secondary | ICD-10-CM

## 2022-11-18 DIAGNOSIS — E119 Type 2 diabetes mellitus without complications: Secondary | ICD-10-CM | POA: Diagnosis not present

## 2022-12-19 DIAGNOSIS — E119 Type 2 diabetes mellitus without complications: Secondary | ICD-10-CM | POA: Diagnosis not present

## 2023-01-19 DIAGNOSIS — E119 Type 2 diabetes mellitus without complications: Secondary | ICD-10-CM | POA: Diagnosis not present

## 2023-01-30 ENCOUNTER — Ambulatory Visit (INDEPENDENT_AMBULATORY_CARE_PROVIDER_SITE_OTHER): Payer: BC Managed Care – PPO | Admitting: Internal Medicine

## 2023-01-30 ENCOUNTER — Encounter: Payer: Self-pay | Admitting: Internal Medicine

## 2023-01-30 VITALS — BP 134/70 | HR 67 | Ht 70.0 in | Wt 230.0 lb

## 2023-01-30 DIAGNOSIS — E113593 Type 2 diabetes mellitus with proliferative diabetic retinopathy without macular edema, bilateral: Secondary | ICD-10-CM | POA: Diagnosis not present

## 2023-01-30 DIAGNOSIS — Z7984 Long term (current) use of oral hypoglycemic drugs: Secondary | ICD-10-CM | POA: Diagnosis not present

## 2023-01-30 DIAGNOSIS — Z7985 Long-term (current) use of injectable non-insulin antidiabetic drugs: Secondary | ICD-10-CM | POA: Diagnosis not present

## 2023-01-30 DIAGNOSIS — Z794 Long term (current) use of insulin: Secondary | ICD-10-CM | POA: Diagnosis not present

## 2023-01-30 DIAGNOSIS — H9313 Tinnitus, bilateral: Secondary | ICD-10-CM | POA: Insufficient documentation

## 2023-01-30 DIAGNOSIS — E1142 Type 2 diabetes mellitus with diabetic polyneuropathy: Secondary | ICD-10-CM | POA: Diagnosis not present

## 2023-01-30 DIAGNOSIS — E785 Hyperlipidemia, unspecified: Secondary | ICD-10-CM | POA: Diagnosis not present

## 2023-01-30 LAB — POCT GLYCOSYLATED HEMOGLOBIN (HGB A1C): Hemoglobin A1C: 7.2 % — AB (ref 4.0–5.6)

## 2023-01-30 MED ORDER — TIRZEPATIDE 5 MG/0.5ML ~~LOC~~ SOAJ: 5.0000 mg | SUBCUTANEOUS | 3 refills | Status: DC

## 2023-01-30 NOTE — Progress Notes (Signed)
Name: Kristine Callahan  Age/ Sex: 57 y.o., female   MRN/ DOB: [id number], 1965/10/05     PCP: Lorenda Ishihara, MD   Reason for Endocrinology Evaluation: Type 2 Diabetes Mellitus  Initial Endocrine Consultative Visit: T2DM and HTN    PATIENT IDENTIFIER: Kristine Callahan is a 57 y.o. female with a past medical history of HTN, T2 DM and ulcerative proctitis. The patient has followed with Endocrinology clinic since 11/29/2019 for consultative assistance with management of her diabetes.  DIABETIC HISTORY:  Kristine Callahan was diagnosed with DM yrs ago, she has been on Glipizide, insulin started 08/2019. Her hemoglobin A1c has ranged from 7.1% , peaking at 11.0%  in 2016.  On her initial visit to our clinic she had an A1c of 7.9% .  She was on Janumet, Farxiga, Victoza and basal insulin. We stopped Janumet , started Metformin, switched Levemir tresiba and continued [place] and Victoza.   She was lost to follow-up until her return and 11/2020 with an A1c of 8.3%   By July 2023 we switched Victoza to Rybelsus  SUBJECTIVE:   During the last visit (07/25/2022): A1c 7.0 %   Today (01/30/2023): Kristine Callahan is here for a follow up on diabetes management.She checks her glucose 3-4 times a day,  through dexcom     She has been diagnosed with ulcerative proctitis , she is s/p rectocele repair in November 2022  Weight has been trending down   GI symptoms have resolved including leakage, improved constipation  Bowel movement stables with constipation , stable rectal leak  Has tinnitus, constant for month, has headaches as well  Also had a tick bite followed by cellulitis   HOME DIABETES REGIMEN:  Metformin 1000 mg Twice daily  Farxiga 10 mg daily  Rybelsus 14 mg daily  Tresiba 26 units daily Novolog 10 units with each meal  CF: NovoLog (BG -130/25) Rosuvastatin 10 mg daily     Statin: yes ACE-I/ARB: yes Prior Diabetic Education: yes   CONTINUOUS GLUCOSE MONITORING RECORD  INTERPRETATION    Dates of Recording: 10/29-11/01/2023  Sensor description:dexcom  Results statistics:   CGM use % of time 93  Average and SD 148/33  Time in range 83%  % Time Above 180 17  % Time above 250 0  % Time Below target <1    Glycemic patterns summary: BGs are optimal overnight and fluctuate during the day  Hyperglycemic episodes  postprandial  Hypoglycemic episodes occurred at night   Overnight periods: trend down     DIABETIC COMPLICATIONS: Microvascular complications:  Neuropathy, DR stable B/L  Denies: CKD  Last Eye Exam: Completed 08/2021, scheduled 7/25  Macrovascular complications:   Denies: CAD, CVA, PVD   HISTORY:  Past Medical History:  Past Medical History:  Diagnosis Date  . 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  . COVID-19 12/2020  . Headache    hx migraines none in years  . Heart murmur, systolic    Negative echocardiogram 2016  . Hyperlipidemia    on meds  . Hypertension    on meds  . Obesity   . 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 Umbilical hernia   . Uncontrolled type 2 diabetes mellitus with hyperglycemia, with long-term current use of insulin (HCC) 11/29/2019   on meds   Past Surgical History:  Past Surgical History:  Procedure Laterality Date  . ANTERIOR AND POSTERIOR REPAIR WITH SACROSPINOUS FIXATION N/A 01/21/2021   Procedure: SACROSPINOUS FIXATION;  Surgeon: Marguerita Beards, MD;  Location:  [facility];  Service: Gynecology;  Laterality: N/A;  . CHOLECYSTECTOMY    . COLONOSCOPY  10/2020   CG-MAC-fair prep + ulcer tics, low sphincter tone  . COLONOSCOPY WITH PROPOFOL N/A 05/31/2016   Procedure: COLONOSCOPY WITH PROPOFOL;  Surgeon: Charolett Bumpers, MD;  Location: [facility];  Service: Endoscopy;  Laterality: N/A;  . FLEXIBLE SIGMOIDOSCOPY N/A 03/26/2021   Procedure: FLEXIBLE SIGMOIDOSCOPY;  Surgeon: Benancio Deeds, MD;  Location: [facility];  Service: Gastroenterology;  Laterality:  N/A;  . HEMOSTASIS CLIP PLACEMENT  03/26/2021   Procedure: HEMOSTASIS CLIP PLACEMENT;  Surgeon: Benancio Deeds, MD;  Location: [facility];  Service: Gastroenterology;;  . HOT HEMOSTASIS N/A 03/26/2021   Procedure: HOT HEMOSTASIS (ARGON PLASMA COAGULATION/BICAP);  Surgeon: Benancio Deeds, MD;  Location: [facility];  Service: Gastroenterology;  Laterality: N/A;  . RECTOCELE REPAIR N/A 01/21/2021   Procedure: POSTERIOR REPAIR (RECTOCELE) and perineorrhaphy;  Surgeon: Marguerita Beards, MD;  Location: [facility];  Service: Gynecology;  Laterality: N/A;   Social History:  reports that she has never smoked. She has never used smokeless tobacco. She reports that she does not currently use alcohol. She reports that she does not use drugs. Family History:  Family History  Adopted: Yes  Problem Relation Age of Onset  . Breast cancer Neg Hx      HOME MEDICATIONS: Allergies as of 01/30/2023       Reactions   Latex Other (See Comments)   Taste sensation         Medication List        Accurate as of January 30, 2023 10:26 AM. If you have any questions, ask your nurse or doctor.          STOP taking these medications    Rybelsus 14 MG Tabs Generic drug: Semaglutide Stopped by: Johnney Ou Ziair Penson       TAKE these medications    Accu-Chek Guide Me w/Device Kit by Does not apply route.   Accu-Chek Guide test strip Generic drug: glucose blood CHECK BLOOD SUGARS 3 TIMES DAILY   aspirin 81 MG tablet Take 81 mg by mouth daily.   clotrimazole 1 % cream Commonly known as: LOTRIMIN 1 application Externally Twice a day as needed for 14 days   dapagliflozin propanediol 10 MG Tabs tablet Commonly known as: Farxiga Take 1 tablet (10 mg total) by mouth daily.   Dexcom G7 Sensor Misc 1 Device by Does not apply route as directed.   docusate sodium 100 MG capsule Commonly known as: Colace Take 1 capsule (100 mg total) by mouth 2 (two) times  daily.   doxycycline 100 MG tablet Commonly known as: ADOXA 1 tablet Orally q 12 for 10 days   estradiol 0.1 MG/GM vaginal cream Commonly known as: ESTRACE PLACE 0.5G NIGHTLY FOR TWO WEEKS THEN TWICE A WEEK AFTER   fenofibrate 160 MG tablet Take 160 mg by mouth daily.   Imiquimod Pump 3.75 % Crea Apply topically.   Insulin Pen Needle 32G X 4 MM Misc 1 Device by Does not apply route in the morning, at noon, in the evening, and at bedtime.   lisinopril-hydrochlorothiazide 20-25 MG tablet Commonly known as: ZESTORETIC Take 1 tablet by mouth daily.   MELATONIN PO Take 1 tablet by mouth at bedtime.   mesalamine 1000 MG suppository Commonly known as: CANASA Place 1 suppository (1,000 mg total) rectally at bedtime.   metFORMIN 1000 MG tablet Commonly known as: GLUCOPHAGE Take 1 tablet (1,000  mg total) by mouth 2 (two) times daily with a meal.   NovoLOG FlexPen 100 UNIT/ML FlexPen Generic drug: insulin aspart Max daily 60 units   polyethylene glycol powder 17 GM/SCOOP powder Commonly known as: GLYCOLAX/MIRALAX Take 17 g by mouth daily. Drink 17g (1 scoop) dissolved in water per day.   rosuvastatin 10 MG tablet Commonly known as: Crestor Take 1 tablet (10 mg total) by mouth daily.   tirzepatide 5 MG/0.5ML Pen Commonly known as: MOUNJARO Inject 5 mg into the skin once a week. Started by: Scarlette Shorts   Evaristo Bury FlexTouch 100 UNIT/ML FlexTouch Pen Generic drug: insulin degludec Inject 26 Units into the skin daily.         OBJECTIVE:   Vital Signs: BP 134/70 (BP Location: Left Arm, Patient Position: Sitting, Cuff Size: Large)   Pulse 67   Ht 5\' 10"  (1.778 m)   Wt 230 lb (104.3 kg)   LMP  (LMP Unknown)   SpO2 99%   BMI 33.00 kg/m   Wt Readings from Last 3 Encounters:  01/30/23 230 lb (104.3 kg)  07/25/22 235 lb (106.6 kg)  02/04/22 238 lb (108 kg)     Exam: General: Pt appears well and is in NAD  Lungs: Clear with good BS bilat   Heart: RRR    Abdomen: soft, nontender  Extremities: No pretibial edema  Neuro: MS is good with appropriate affect, pt is alert and Ox3    DM foot exam:07/25/2022  The skin of the feet is intact without sores or ulcerations. The pedal pulses are 1+ on right and 1+ on left. The sensation is intact to a screening 5.07, 10 gram monofilament bilaterally          DATA REVIEWED:  Lab Results  Component Value Date   HGBA1C 7.2 (A) 01/30/2023   HGBA1C 7.0 (A) 07/25/2022   HGBA1C 8.1 (A) 02/04/2022    Latest Reference Range & Units 07/25/22 10:07  Sodium 135 - 145 mEq/L 137  Potassium 3.5 - 5.1 mEq/L 3.9  Chloride 96 - 112 mEq/L 100  CO2 19 - 32 mEq/L 24  Glucose 70 - 99 mg/dL 147 (H)  BUN 6 - 23 mg/dL 18  Creatinine 8.29 - 5.62 mg/dL 1.30  Calcium 8.4 - 86.5 mg/dL 9.6  GFR >78.46 mL/min 89.96  Total CHOL/HDL Ratio  5  Cholesterol 0 - 200 mg/dL 962  HDL Cholesterol >95.28 mg/dL 41.32 (L)  Direct LDL mg/dL 44.0  MICROALB/CREAT RATIO 0.0 - 30.0 mg/g 9.9  NonHDL  105.79  Triglycerides 0.0 - 149.0 mg/dL 102.7 (H)  VLDL 0.0 - 25.3 mg/dL 66.4 (H)     Latest Reference Range & Units 07/25/22 10:07  Total CHOL/HDL Ratio  5  Cholesterol 0 - 200 mg/dL 403  HDL Cholesterol >47.42 mg/dL 59.56 (L)  Direct LDL mg/dL 38.7  MICROALB/CREAT RATIO 0.0 - 30.0 mg/g 9.9  NonHDL  105.79  Triglycerides 0.0 - 149.0 mg/dL 564.3 (H)  VLDL 0.0 - 32.9 mg/dL 51.8 (H)    Latest Reference Range & Units 07/25/22 10:07  Creatinine,U mg/dL 84.1  Microalb, Ur 0.0 - 1.9 mg/dL 4.4 (H)  MICROALB/CREAT RATIO 0.0 - 30.0 mg/g 9.9  (H): Data is abnormally high   ASSESSMENT / PLAN / RECOMMENDATIONS:   1) Type 2 Diabetes Mellitus, Optimally controlled, With retinopathic and neuropathic  complications - Most recent A1c of 7.2 %. Goal A1c < 7.0 %.     -A1c has trended up  -I have recommended switching Rybelsus to [facility]  due to worsening glycemic control -No changes to metformin, Marcelline Deist, or MDI regimen -Patient to  contact us in 6 weeks to increase the dose if no side effects  MEDICATIONS:  -Switch Rybelsus to Mounjaro 5 mg weekly - Continue Metformin 1000 mg Twice daily  - Continue Farxiga 10 mg daily  - Decrease Tresiba 26 units daily  - Continue  Novolog 10 units with each meal  - CF: NovoLog (BG -130/25)    EDUCATION / INSTRUCTIONS: BG monitoring instructions: Patient is instructed to check her blood sugars 3 times a day, before meals. Call [facility] if: BG persistently < 70  I reviewed the Rule of 15 for the treatment of hypoglycemia in detail with the patient. Literature supplied.   2) Diabetic complications:  Eye: Does  have known diabetic retinopathy.  Neuro/ Feet: Does not have known diabetic peripheral neuropathy .  Renal: Patient does not have known baseline CKD. She   is on an ACEI/ARB at present.     3) Dyslipidemia:  -TG elevated in the past, LDL acceptable -I have increased rosuvastatin 07/2022, tolerating well  Medication  Continue rosuvastatin 10 mg daily   F/U in 4 months   Signed electronically by: Lyndle Herrlich, MD  [facility] Endocrinology  [facility] [address] [place] [place], [place] [address] Phone: [phone] FAX: [phone]   CC: Lorenda Ishihara, MD [address] [place] [address] Phone: [phone]  Fax: [phone]  Return to Endocrinology clinic as below: No future appointments.

## 2023-01-30 NOTE — Patient Instructions (Signed)
-   Switch Rybelsus to Mounjaro 5 mg, ONCE weekly  - Continue Metformin 1000 mg Twice daily  - Continue Farxiga 10 mg daily  - Continue Tresiba  26 units daily  - Continue Novolog 10 units with each meal  -Novolog correctional insulin: ADD extra units on insulin to your meal-time Novolog dose if your blood sugars are higher than 155. Use the scale below to help guide you:   Blood sugar before meal Number of units to inject  Less than 155 0 unit  156 -  180 1 units  181 -  205 2 units  206 -  230 3 units  231 -  255 4 units  256 -  280 5 units  281 -  305 6 units  306 -  330 7 units  331 -  355 8 units     HOW TO TREAT LOW BLOOD SUGARS (Blood sugar LESS THAN 70 MG/DL) Please follow the RULE OF 15 for the treatment of hypoglycemia treatment (when your (blood sugars are less than 70 mg/dL)   STEP 1: Take 15 grams of carbohydrates when your blood sugar is low, which includes:  3-4 GLUCOSE TABS  OR 3-4 OZ OF JUICE OR REGULAR SODA OR ONE TUBE OF GLUCOSE GEL    STEP 2: RECHECK blood sugar in 15 MINUTES STEP 3: If your blood sugar is still low at the 15 minute recheck --> then, go back to STEP 1 and treat AGAIN with another 15 grams of carbohydrates.

## 2023-01-31 ENCOUNTER — Encounter: Payer: Self-pay | Admitting: Internal Medicine

## 2023-02-27 DIAGNOSIS — H9313 Tinnitus, bilateral: Secondary | ICD-10-CM | POA: Diagnosis not present

## 2023-03-01 ENCOUNTER — Other Ambulatory Visit: Payer: Self-pay | Admitting: Internal Medicine

## 2023-03-01 DIAGNOSIS — E1142 Type 2 diabetes mellitus with diabetic polyneuropathy: Secondary | ICD-10-CM | POA: Diagnosis not present

## 2023-03-01 DIAGNOSIS — E785 Hyperlipidemia, unspecified: Secondary | ICD-10-CM | POA: Diagnosis not present

## 2023-03-01 DIAGNOSIS — I10 Essential (primary) hypertension: Secondary | ICD-10-CM | POA: Diagnosis not present

## 2023-03-01 DIAGNOSIS — E2839 Other primary ovarian failure: Secondary | ICD-10-CM

## 2023-03-01 DIAGNOSIS — E1139 Type 2 diabetes mellitus with other diabetic ophthalmic complication: Secondary | ICD-10-CM | POA: Diagnosis not present

## 2023-03-01 DIAGNOSIS — Z0000 Encounter for general adult medical examination without abnormal findings: Secondary | ICD-10-CM | POA: Diagnosis not present

## 2023-04-28 ENCOUNTER — Other Ambulatory Visit: Payer: Self-pay

## 2023-04-28 MED ORDER — DEXCOM G7 SENSOR MISC: 1.0000 | 3 refills | Status: DC

## 2023-05-30 ENCOUNTER — Encounter: Payer: Self-pay | Admitting: Internal Medicine

## 2023-05-30 ENCOUNTER — Ambulatory Visit (INDEPENDENT_AMBULATORY_CARE_PROVIDER_SITE_OTHER): Payer: BC Managed Care – PPO | Admitting: Internal Medicine

## 2023-05-30 VITALS — BP 120/72 | HR 86 | Ht 70.0 in | Wt 235.0 lb

## 2023-05-30 DIAGNOSIS — E785 Hyperlipidemia, unspecified: Secondary | ICD-10-CM | POA: Diagnosis not present

## 2023-05-30 DIAGNOSIS — Z7984 Long term (current) use of oral hypoglycemic drugs: Secondary | ICD-10-CM | POA: Diagnosis not present

## 2023-05-30 DIAGNOSIS — Z794 Long term (current) use of insulin: Secondary | ICD-10-CM

## 2023-05-30 DIAGNOSIS — E1142 Type 2 diabetes mellitus with diabetic polyneuropathy: Secondary | ICD-10-CM

## 2023-05-30 DIAGNOSIS — Z7985 Long-term (current) use of injectable non-insulin antidiabetic drugs: Secondary | ICD-10-CM

## 2023-05-30 DIAGNOSIS — E113593 Type 2 diabetes mellitus with proliferative diabetic retinopathy without macular edema, bilateral: Secondary | ICD-10-CM | POA: Diagnosis not present

## 2023-05-30 LAB — POCT GLYCOSYLATED HEMOGLOBIN (HGB A1C): Hemoglobin A1C: 7.1 % — AB (ref 4.0–5.6)

## 2023-05-30 MED ORDER — INSULIN PEN NEEDLE 32G X 4 MM MISC: 1.0000 | Freq: Four times a day (QID) | 2 refills | Status: DC

## 2023-05-30 MED ORDER — TRESIBA FLEXTOUCH 100 UNIT/ML ~~LOC~~ SOPN
20.0000 [IU] | PEN_INJECTOR | Freq: Every day | SUBCUTANEOUS | 4 refills | Status: DC
Start: 2023-05-30 — End: 2023-10-13

## 2023-05-30 MED ORDER — NOVOLOG FLEXPEN 100 UNIT/ML ~~LOC~~ SOPN: PEN_INJECTOR | SUBCUTANEOUS | 3 refills | Status: DC

## 2023-05-30 MED ORDER — TIRZEPATIDE 7.5 MG/0.5ML ~~LOC~~ SOAJ: 7.5000 mg | SUBCUTANEOUS | 3 refills | Status: DC

## 2023-05-30 MED ORDER — ROSUVASTATIN CALCIUM 10 MG PO TABS: 10.0000 mg | ORAL_TABLET | Freq: Every day | ORAL | 3 refills | Status: DC

## 2023-05-30 NOTE — Progress Notes (Signed)
 Name: Kristine Callahan  Age/ Sex: 58 y.o., female   MRN/ DOB: [id number], [birth year]     PCP: Lorenda Ishihara, MD   Reason for Endocrinology Evaluation: Type 2 Diabetes Mellitus  Initial Endocrine Consultative Visit: T2DM and HTN    PATIENT IDENTIFIER: Kristine Callahan is a 58 y.o. female with a past medical history of HTN, T2 DM and ulcerative proctitis. The patient has followed with Endocrinology clinic since 11/29/2019 for consultative assistance with management of her diabetes.  DIABETIC HISTORY:  Kristine Callahan was diagnosed with DM yrs ago, she has been on Glipizide, insulin started 08/2019. Her hemoglobin A1c has ranged from 7.1% , peaking at 11.0%  in 2016.  On her initial visit to our clinic she had an A1c of 7.9% .  She was on Janumet, Farxiga, Victoza and basal insulin. We stopped Janumet , started Metformin, switched Levemir tresiba and continued [place] and Victoza.   She was lost to follow-up until her return and 11/2020 with an A1c of 8.3%   By July 2023 we switched Victoza to Rybelsus  Switch Rybelsus to [facility] 01/2023 with an A1c of 7.2%  SUBJECTIVE:   During the last visit (01/30/2023): A1c 7.2 %   Today (05/30/2023): Kristine Callahan is here for a follow up on diabetes management.She checks her glucose 3-4 times a day,  through dexcom.  Patient has been noted with hypoglycemia,she is not symptomatic   Denies nausea or vomiting  Has mild constipation  which is manageable    HOME DIABETES REGIMEN:  Metformin 1000 mg Twice daily  Farxiga 10 mg daily  Mounjaro 5 mg weekly Tresiba 26 units daily Novolog 10 units with each meal  CF: NovoLog (BG -130/25) Rosuvastatin 10 mg daily     Statin: yes ACE-I/ARB: yes Prior Diabetic Education: yes   CONTINUOUS GLUCOSE MONITORING RECORD INTERPRETATION    Dates of Recording: 2/26-3/01/2024  Sensor description:dexcom  Results statistics:   CGM use % of time 94  Average and SD 152/36  Time in range 80%  % Time  Above 180 19  % Time above 250 1  % Time Below target 0    Glycemic patterns summary: BGs are optimal overnight and fluctuate during the day  Hyperglycemic episodes  postprandial  Hypoglycemic episodes occurred at night   Overnight periods: trend down     DIABETIC COMPLICATIONS: Microvascular complications:  Neuropathy, DR stable B/L  Denies: CKD  Last Eye Exam:  scheduled 04/17/2023  Macrovascular complications:   Denies: CAD, CVA, PVD   HISTORY:  Past Medical History:  Past Medical History:  Diagnosis Date   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   COVID-19 12/2020   Headache    hx migraines none in years   Heart murmur, systolic    Negative echocardiogram 2016   Hyperlipidemia    on meds   Hypertension    on meds   Obesity    Prolapse of 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    Umbilical hernia    Uncontrolled type 2 diabetes mellitus with hyperglycemia, with long-term current use of insulin (HCC) 11/29/2019   on meds   Past Surgical History:  Past Surgical History:  Procedure Laterality Date   ANTERIOR AND POSTERIOR REPAIR WITH SACROSPINOUS FIXATION N/A 01/21/2021   Procedure: SACROSPINOUS FIXATION;  Surgeon: Marguerita Beards, MD;  Location: [facility];  Service: Gynecology;  Laterality: N/A;   CHOLECYSTECTOMY     COLONOSCOPY  10/2020   CG-MAC-fair prep + ulcer tics, low sphincter tone  COLONOSCOPY WITH PROPOFOL N/A 05/31/2016   Procedure: COLONOSCOPY WITH PROPOFOL;  Surgeon: Charolett Bumpers, MD;  Location: [facility];  Service: Endoscopy;  Laterality: N/A;   FLEXIBLE SIGMOIDOSCOPY N/A 03/26/2021   Procedure: FLEXIBLE SIGMOIDOSCOPY;  Surgeon: Benancio Deeds, MD;  Location: [facility];  Service: Gastroenterology;  Laterality: N/A;   HEMOSTASIS CLIP PLACEMENT  03/26/2021   Procedure: HEMOSTASIS CLIP PLACEMENT;  Surgeon: Benancio Deeds, MD;  Location: [facility];  Service: Gastroenterology;;   HOT HEMOSTASIS N/A 03/26/2021    Procedure: HOT HEMOSTASIS (ARGON PLASMA COAGULATION/BICAP);  Surgeon: Benancio Deeds, MD;  Location: [facility];  Service: Gastroenterology;  Laterality: N/A;   RECTOCELE REPAIR N/A 01/21/2021   Procedure: POSTERIOR REPAIR (RECTOCELE) and perineorrhaphy;  Surgeon: Marguerita Beards, MD;  Location: [facility];  Service: Gynecology;  Laterality: N/A;   Social History:  reports that she has never smoked. She has never used smokeless tobacco. She reports that she does not currently use alcohol. She reports that she does not use drugs. Family History:  Family History  Adopted: Yes  Problem Relation Age of Onset   Breast cancer Neg Hx      HOME MEDICATIONS: Allergies as of 05/30/2023       Reactions   Latex Other (See Comments)   Taste sensation         Medication List        Accurate as of May 30, 2023 11:00 AM. If you have any questions, ask your nurse or doctor.          Accu-Chek Guide Me w/Device Kit by Does not apply route.   Accu-Chek Guide test strip Generic drug: glucose blood CHECK BLOOD SUGARS 3 TIMES DAILY   aspirin 81 MG tablet Take 81 mg by mouth daily.   clotrimazole 1 % cream Commonly known as: LOTRIMIN 1 application Externally Twice a day as needed for 14 days   dapagliflozin propanediol 10 MG Tabs tablet Commonly known as: Farxiga Take 1 tablet (10 mg total) by mouth daily.   Dexcom G7 Sensor Misc 1 Device by Does not apply route as directed.   docusate sodium 100 MG capsule Commonly known as: Colace Take 1 capsule (100 mg total) by mouth 2 (two) times daily.   doxycycline 100 MG tablet Commonly known as: ADOXA 1 tablet Orally q 12 for 10 days   estradiol 0.1 MG/GM vaginal cream Commonly known as: ESTRACE PLACE 0.5G NIGHTLY FOR TWO WEEKS THEN TWICE A WEEK AFTER   fenofibrate 160 MG tablet Take 160 mg by mouth daily.   Imiquimod Pump 3.75 % Crea Apply topically.   Insulin Pen Needle 32G X 4 MM Misc 1  Device by Does not apply route in the morning, at noon, in the evening, and at bedtime.   lisinopril-hydrochlorothiazide 20-25 MG tablet Commonly known as: ZESTORETIC Take 1 tablet by mouth daily.   MELATONIN PO Take 1 tablet by mouth at bedtime.   mesalamine 1000 MG suppository Commonly known as: CANASA Place 1 suppository (1,000 mg total) rectally at bedtime.   metFORMIN 1000 MG tablet Commonly known as: GLUCOPHAGE Take 1 tablet (1,000 mg total) by mouth 2 (two) times daily with a meal.   NovoLOG FlexPen 100 UNIT/ML FlexPen Generic drug: insulin aspart Max daily 60 units   polyethylene glycol powder 17 GM/SCOOP powder Commonly known as: GLYCOLAX/MIRALAX Take 17 g by mouth daily. Drink 17g (1 scoop) dissolved in water per day.   rosuvastatin 10 MG tablet Commonly known as: Crestor  Take 1 tablet (10 mg total) by mouth daily.   tirzepatide 5 MG/0.5ML Pen Commonly known as: MOUNJARO Inject 5 mg into the skin once a week.   Evaristo Bury FlexTouch 100 UNIT/ML FlexTouch Pen Generic drug: insulin degludec Inject 26 Units into the skin daily.         OBJECTIVE:   Vital Signs: BP 120/72 (BP Location: Left Arm, Patient Position: Sitting, Cuff Size: Normal)   Pulse 86   Ht 5\' 10"  (1.778 m)   Wt 235 lb (106.6 kg)   LMP  (LMP Unknown)   SpO2 96%   BMI 33.72 kg/m   Wt Readings from Last 3 Encounters:  05/30/23 235 lb (106.6 kg)  01/30/23 230 lb (104.3 kg)  07/25/22 235 lb (106.6 kg)     Exam: General: Pt appears well and is in NAD  Lungs: Clear with good BS bilat   Heart: RRR   Extremities: Trace pretibial edema  Neuro: MS is good with appropriate affect, pt is alert and Ox3    DM foot exam: 05/30/2023  The skin of the feet is intact without sores or ulcerations. The pedal pulses are 1+ on right and 1+ on left. The sensation is intact to a screening 5.07, 10 gram monofilament bilaterally          DATA REVIEWED:  Lab Results  Component Value Date   HGBA1C  7.1 (A) 05/30/2023   HGBA1C 7.2 (A) 01/30/2023   HGBA1C 7.0 (A) 07/25/2022    Latest Reference Range & Units 07/25/22 10:07  Sodium 135 - 145 mEq/L 137  Potassium 3.5 - 5.1 mEq/L 3.9  Chloride 96 - 112 mEq/L 100  CO2 19 - 32 mEq/L 24  Glucose 70 - 99 mg/dL 213 (H)  BUN 6 - 23 mg/dL 18  Creatinine 0.86 - 5.78 mg/dL 4.69  Calcium 8.4 - 62.9 mg/dL 9.6  GFR >52.84 mL/min 89.96  Total CHOL/HDL Ratio  5  Cholesterol 0 - 200 mg/dL 132  HDL Cholesterol >44.01 mg/dL 02.72 (L)  Direct LDL mg/dL 53.6  MICROALB/CREAT RATIO 0.0 - 30.0 mg/g 9.9  NonHDL  105.79  Triglycerides 0.0 - 149.0 mg/dL 644.0 (H)  VLDL 0.0 - 34.7 mg/dL 42.5 (H)     Latest Reference Range & Units 07/25/22 10:07  Total CHOL/HDL Ratio  5  Cholesterol 0 - 200 mg/dL 956  HDL Cholesterol >38.75 mg/dL 64.33 (L)  Direct LDL mg/dL 29.5  MICROALB/CREAT RATIO 0.0 - 30.0 mg/g 9.9  NonHDL  105.79  Triglycerides 0.0 - 149.0 mg/dL 188.4 (H)  VLDL 0.0 - 16.6 mg/dL 06.3 (H)    Latest Reference Range & Units 07/25/22 10:07  Creatinine,U mg/dL 01.6  Microalb, Ur 0.0 - 1.9 mg/dL 4.4 (H)  MICROALB/CREAT RATIO 0.0 - 30.0 mg/g 9.9  (H): Data is abnormally high   ASSESSMENT / PLAN / RECOMMENDATIONS:   1) Type 2 Diabetes Mellitus, Optimally controlled, With retinopathic and neuropathic  complications - Most recent A1c of 7.1 %. Goal A1c < 7.0 %.     -A1c stable  -I had switched  Rybelsus to [facility] due to lack of clinical outcome  -With hypoglycemia overnight, will decrease [place] as well as NovoLog -She will continue to use correction scale as needed before each meal  MEDICATIONS:  -Mounjaro 5 mg weekly -Continue Metformin 1000 mg Twice daily  -Continue Farxiga 10 mg daily -Increase Mounjaro 7.5 mg weekly - Decrease Tresiba 20 units daily  - Decrease Novolog 8 units with each meal  - Continue CF: NovoLog (  BG -130/25) TIDQAC    EDUCATION / INSTRUCTIONS: BG monitoring instructions: Patient is instructed to check her  blood sugars 3 times a day, before meals. Call [facility] if: BG persistently < 70  I reviewed the Rule of 15 for the treatment of hypoglycemia in detail with the patient. Literature supplied.   2) Diabetic complications:  Eye: Does  have known diabetic retinopathy.  Neuro/ Feet: Does not have known diabetic peripheral neuropathy .  Renal: Patient does not have known baseline CKD. She   is on an ACEI/ARB at present.     3) Dyslipidemia:  -TG elevated in the past, LDL acceptable -I have increased rosuvastatin 07/2022, tolerating well  Medication  Continue rosuvastatin 10 mg daily   F/U in 4 months   Signed electronically by: Lyndle Herrlich, MD  [facility] Endocrinology  [facility] [address] [place] [place], [place] [address] Phone: [phone] FAX: [phone]   CC: Lorenda Ishihara, MD [address] [place] [address] Phone: [phone]  Fax: [phone]  Return to Endocrinology clinic as below: Future Appointments  Date Time Provider Department Center  10/27/2023  9:30 AM [facility] DX DEXA 1 [facility]

## 2023-05-30 NOTE — Patient Instructions (Addendum)
-   Increase  Mounjaro 7.5 mg, ONCE weekly  - Continue Metformin 1000 mg Twice daily  - Continue Farxiga 10 mg daily  - Decrease  Tresiba  20 units daily  - Decrease Novolog 8 units with each meal  -Novolog correctional insulin: ADD extra units on insulin to your meal-time Novolog dose if your blood sugars are higher than 155. Use the scale below to help guide you:   Blood sugar before meal Number of units to inject  Less than 155 0 unit  156 -  180 1 units  181 -  205 2 units  206 -  230 3 units  231 -  255 4 units  256 -  280 5 units  281 -  305 6 units  306 -  330 7 units  331 -  355 8 units     HOW TO TREAT LOW BLOOD SUGARS (Blood sugar LESS THAN 70 MG/DL) Please follow the RULE OF 15 for the treatment of hypoglycemia treatment (when your (blood sugars are less than 70 mg/dL)   STEP 1: Take 15 grams of carbohydrates when your blood sugar is low, which includes:  3-4 GLUCOSE TABS  OR 3-4 OZ OF JUICE OR REGULAR SODA OR ONE TUBE OF GLUCOSE GEL    STEP 2: RECHECK blood sugar in 15 MINUTES STEP 3: If your blood sugar is still low at the 15 minute recheck --> then, go back to STEP 1 and treat AGAIN with another 15 grams of carbohydrates.

## 2023-06-30 IMAGING — MG MM DIGITAL SCREENING BILAT W/ TOMO AND CAD
8 series · 8 of 24 positions shown · non-contrast
Comparison: Previous exam(s).

CLINICAL DATA: Screening.

EXAM:
DIGITAL SCREENING BILATERAL MAMMOGRAM WITH TOMOSYNTHESIS AND CAD
TECHNIQUE: Bilateral screening digital craniocaudal and mediolateral oblique
mammograms were obtained. Bilateral screening digital breast
tomosynthesis was performed. The images were evaluated with
computer-aided detection.

[L CC synth-2D]
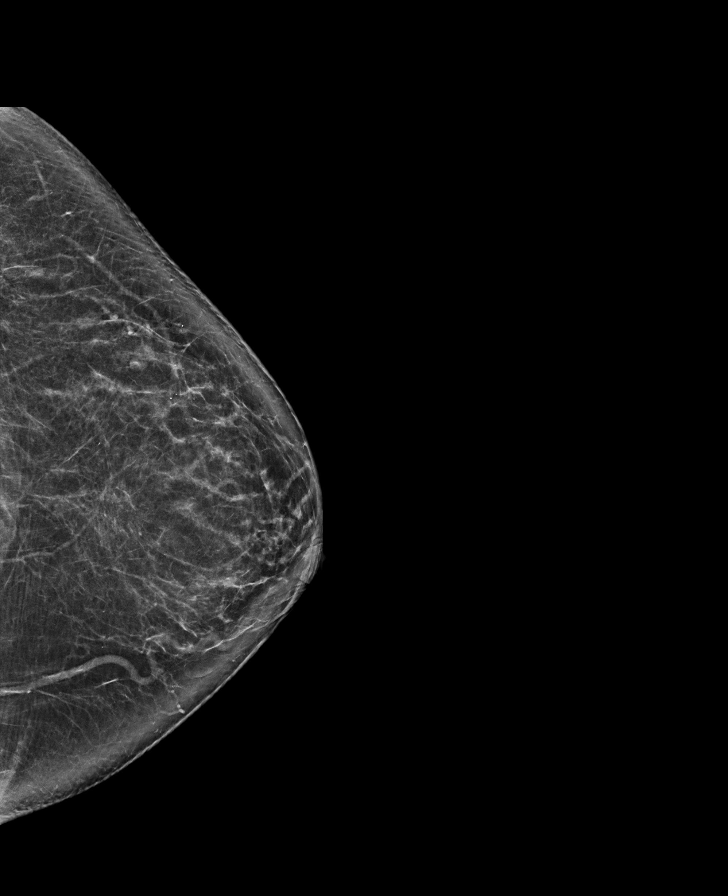

[R MLO synth-2D]
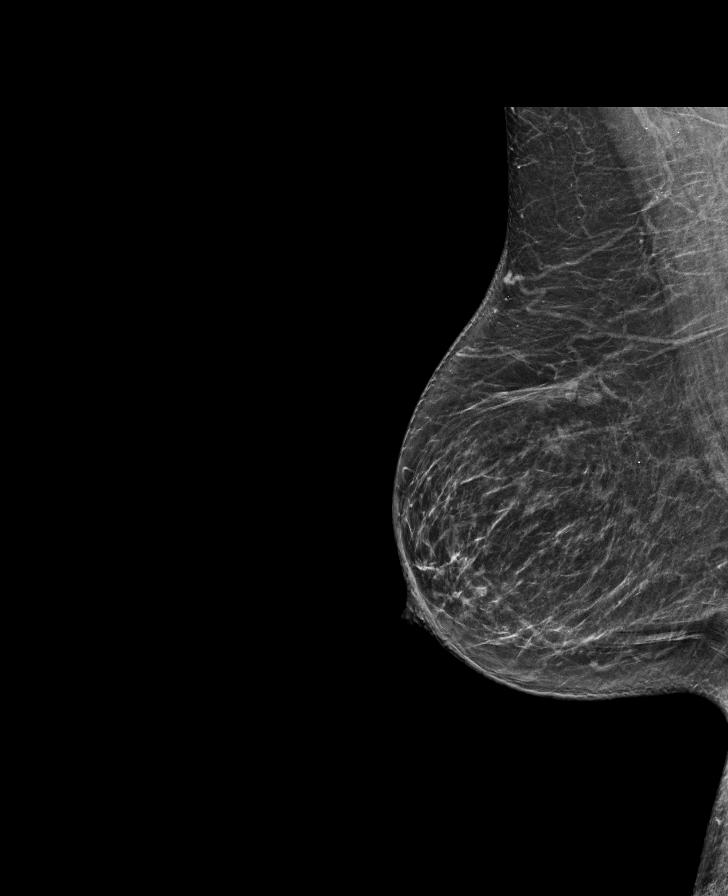

[R CC synth-2D]
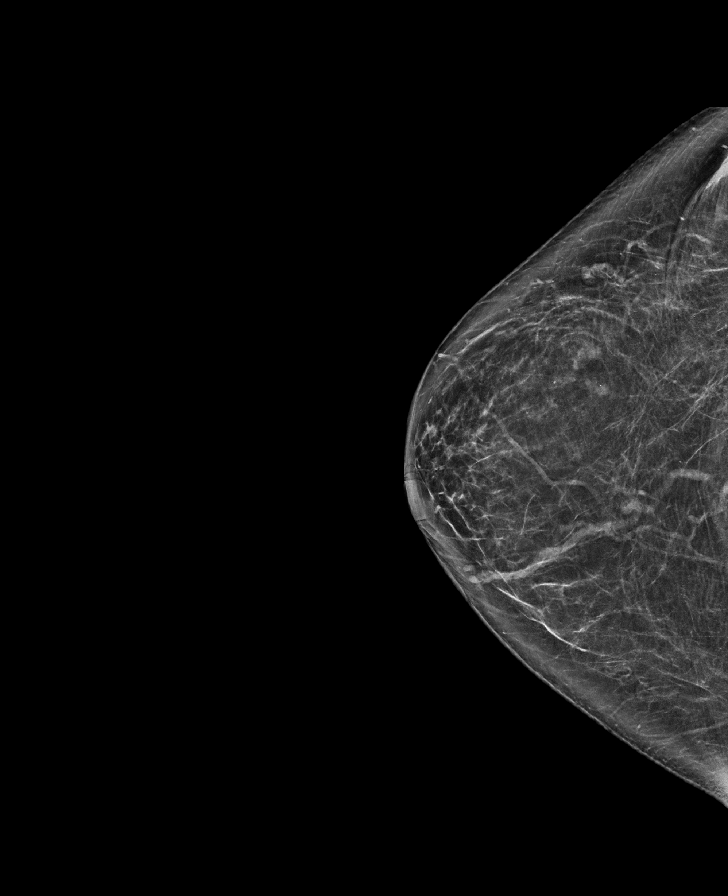

[L MLO synth-2D]
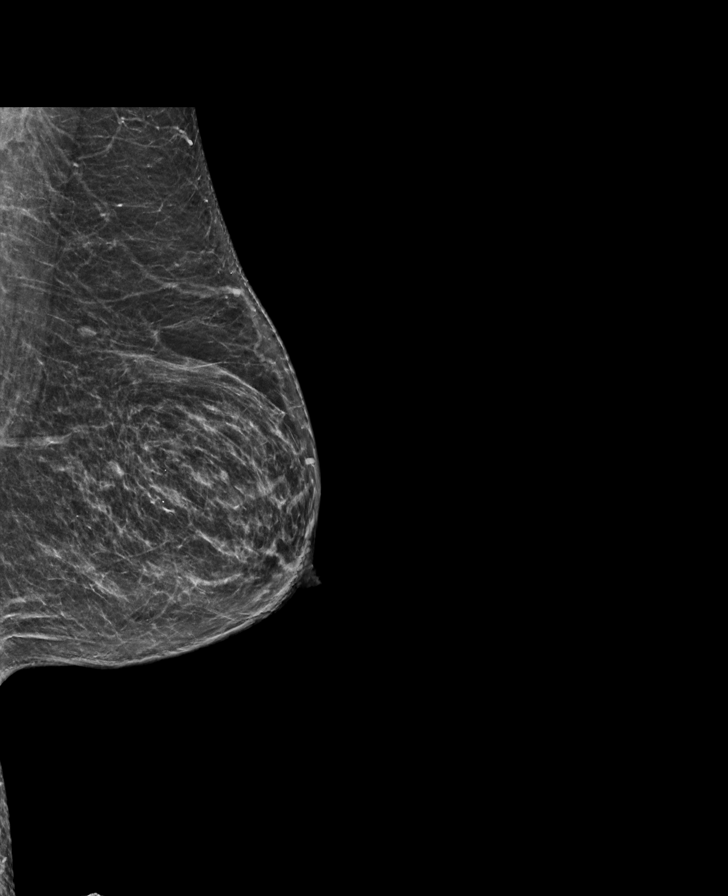

[R CC tomo · tomo slice 37/72.0]
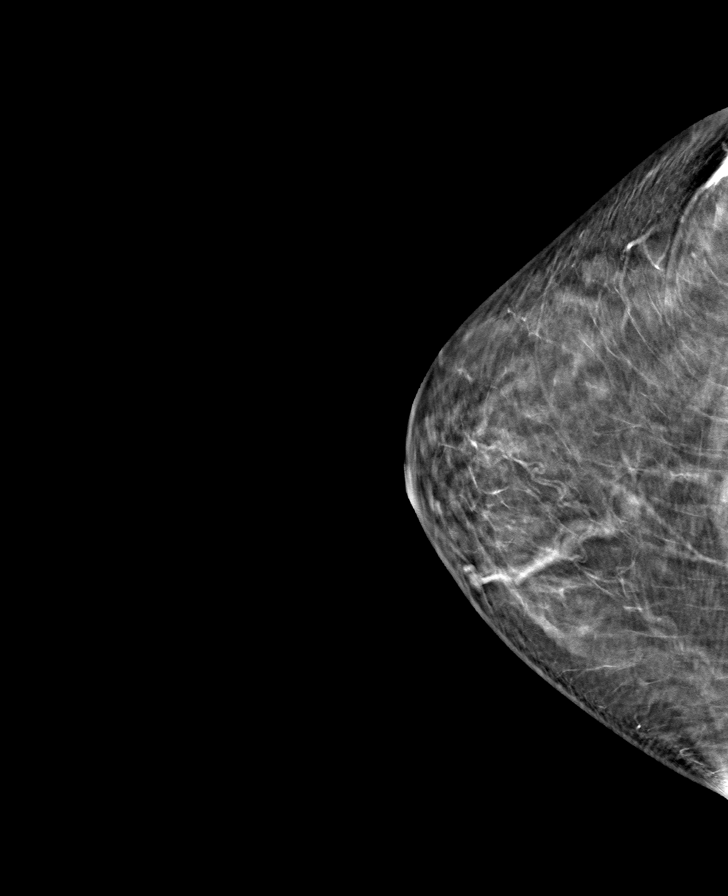

[R MLO tomo · tomo slice 35/68.0]
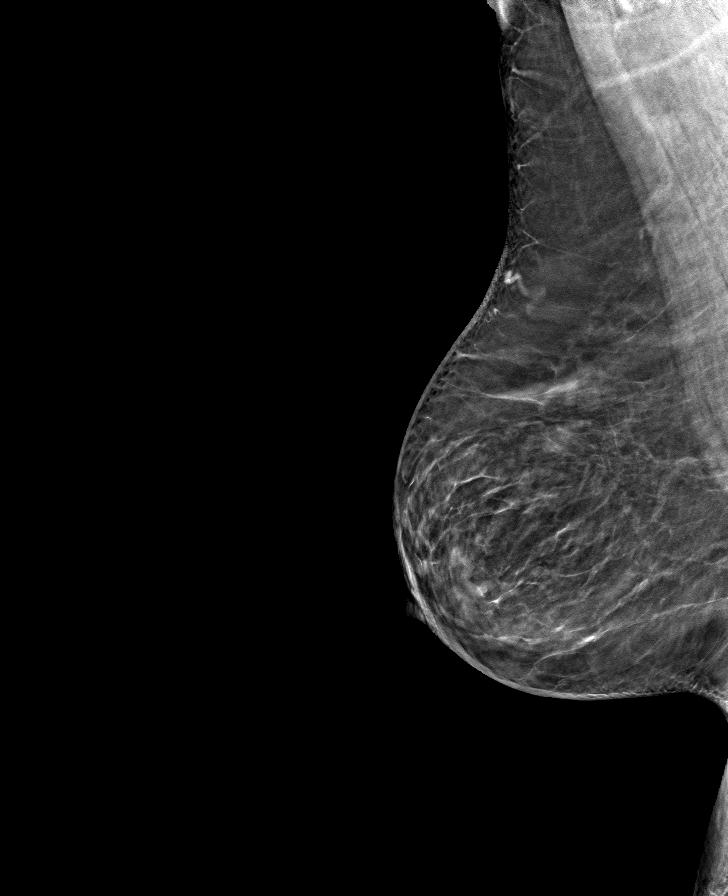

[L MLO tomo · tomo slice 35/69.0]
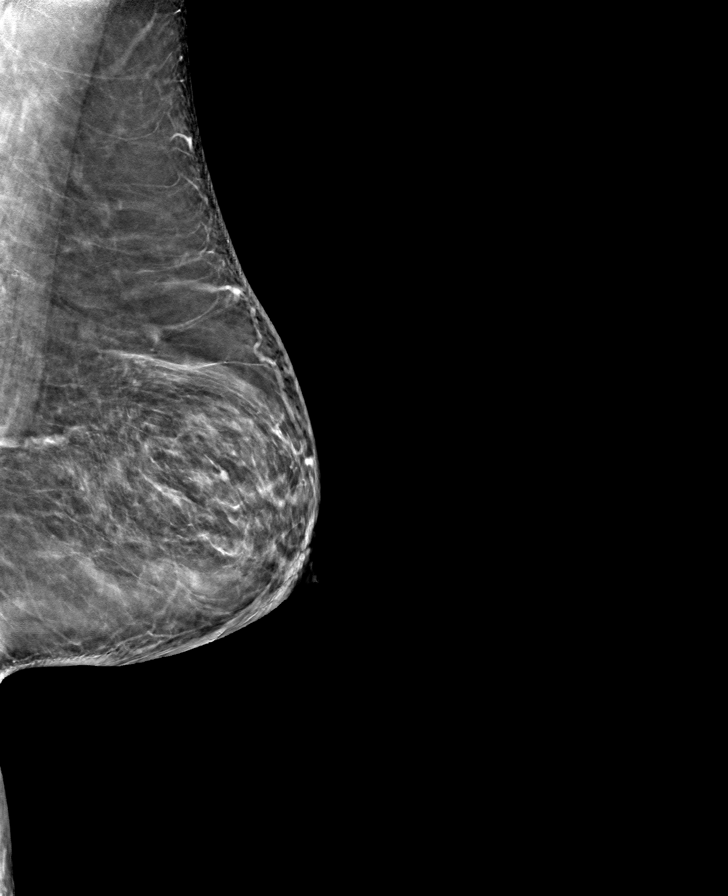

[L CC tomo · tomo slice 36/71.0]
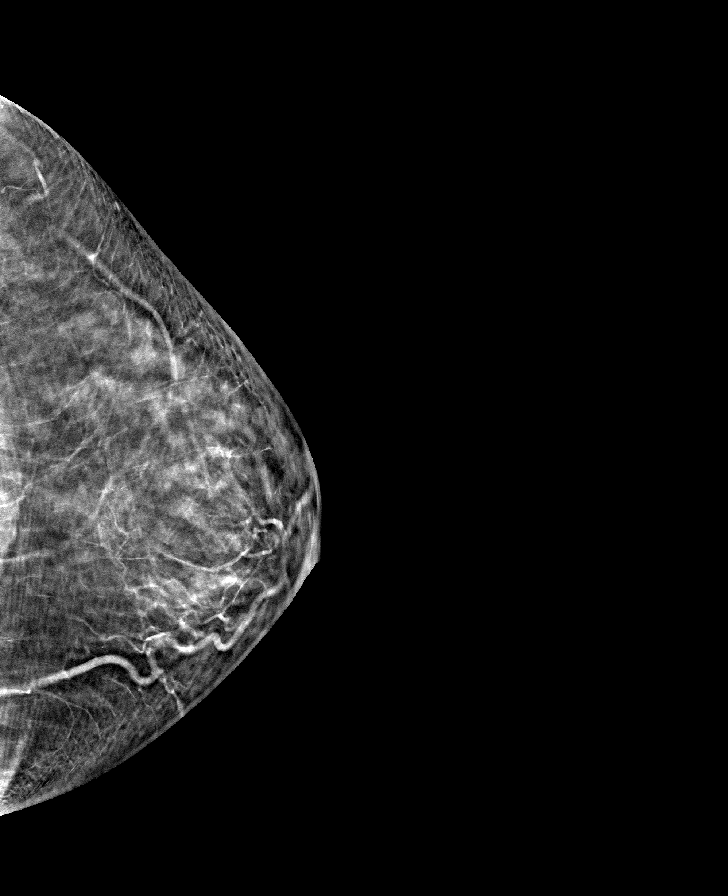

[8 of 24 positions shown; findings below may reference images not displayed]

ACR Breast Density Category b: There are scattered areas of
fibroglandular density.
FINDINGS: There are no findings suspicious for malignancy.
IMPRESSION: No mammographic evidence of malignancy. A result letter of this
screening mammogram will be mailed directly to the patient.

RECOMMENDATION:
Screening mammogram in one year. (Code:51-O-LD2)

BI-RADS CATEGORY  1: Negative.

## 2023-07-16 ENCOUNTER — Other Ambulatory Visit: Payer: Self-pay | Admitting: Internal Medicine

## 2023-07-16 DIAGNOSIS — E1142 Type 2 diabetes mellitus with diabetic polyneuropathy: Secondary | ICD-10-CM

## 2023-09-29 ENCOUNTER — Ambulatory Visit: Admitting: Internal Medicine

## 2023-10-13 ENCOUNTER — Other Ambulatory Visit: Payer: Self-pay | Admitting: Internal Medicine

## 2023-10-13 DIAGNOSIS — E1142 Type 2 diabetes mellitus with diabetic polyneuropathy: Secondary | ICD-10-CM

## 2023-10-16 ENCOUNTER — Ambulatory Visit (INDEPENDENT_AMBULATORY_CARE_PROVIDER_SITE_OTHER): Admitting: Internal Medicine

## 2023-10-16 ENCOUNTER — Encounter: Payer: Self-pay | Admitting: Internal Medicine

## 2023-10-16 VITALS — BP 134/70 | HR 98 | Ht 70.0 in | Wt 241.0 lb

## 2023-10-16 DIAGNOSIS — E785 Hyperlipidemia, unspecified: Secondary | ICD-10-CM

## 2023-10-16 DIAGNOSIS — Z794 Long term (current) use of insulin: Secondary | ICD-10-CM

## 2023-10-16 DIAGNOSIS — E1165 Type 2 diabetes mellitus with hyperglycemia: Secondary | ICD-10-CM

## 2023-10-16 DIAGNOSIS — E1142 Type 2 diabetes mellitus with diabetic polyneuropathy: Secondary | ICD-10-CM | POA: Diagnosis not present

## 2023-10-16 LAB — POCT GLYCOSYLATED HEMOGLOBIN (HGB A1C): Hemoglobin A1C: 7.5 % — AB (ref 4.0–5.6)

## 2023-10-16 MED ORDER — TIRZEPATIDE 10 MG/0.5ML ~~LOC~~ SOAJ: 10.0000 mg | SUBCUTANEOUS | 3 refills | Status: DC

## 2023-10-16 NOTE — Progress Notes (Signed)
 Name: Kristine Callahan  Age/ Sex: 58 y.o., female   MRN/ DOB: [id number], Feb 16, 1966     PCP: Elliot Charm, MD   Reason for Endocrinology Evaluation: Type 2 Diabetes Mellitus  Initial Endocrine Consultative Visit: T2DM and HTN    PATIENT IDENTIFIER: Kristine Callahan is a 58 y.o. female with a past medical history of HTN, T2 DM and ulcerative proctitis. The patient has followed with Endocrinology clinic since 11/29/2019 for consultative assistance with management of her diabetes.  DIABETIC HISTORY:  Kristine Callahan was diagnosed with DM yrs ago, she has been on Glipizide, insulin  started 08/2019. Her hemoglobin A1c has ranged from 7.1% , peaking at 11.0%  in 2016.  On her initial visit to our clinic she had an A1c of 7.9% .  She was on Janumet, Farxiga , Victoza  and basal insulin . We stopped Janumet , started Metformin , switched Levemir tresiba  and continued Farxiga  and Victoza .   She was lost to follow-up until her return and 11/2020 with an A1c of 8.3%   By July 2023 we switched Victoza  to Rybelsus   Switch Rybelsus  to Mounjaro  01/2023 with an A1c of 7.2%  SUBJECTIVE:   During the last visit (05/30/2023): A1c 7.1%   Today (10/16/2023): Kristine Callahan is here for a follow up on diabetes management.She checks her glucose 3-4 times a day,  through dexcom. N0 hypoglycemia    Patient has been noted with persistent hyperglycemia and has increased Tresiba /NovoLog   Denies nausea or vomiting  She has been taking Colace 3 times daily for constipation   HOME DIABETES REGIMEN:  Metformin  1000 mg Twice daily  Farxiga  10 mg daily  Mounjaro  7.5 mg weekly Tresiba  20 units daily-takes 28 units Novolog  8 units with each meal -has been taking 10 units CF: NovoLog  (BG -130/25) Rosuvastatin  10 mg daily     Statin: yes ACE-I/ARB: yes Prior Diabetic Education: yes   CONTINUOUS GLUCOSE MONITORING RECORD INTERPRETATION    Dates of Recording: 7/15-7/28/2025  Sensor  description:dexcom  Results statistics:   CGM use % of time 95  Average and SD 172/35  Time in range 61%  % Time Above 180 37  % Time above 250 2  % Time Below target 0    Glycemic patterns summary: BGs are optimal overnight and fluctuate during the day  Hyperglycemic episodes  postprandial  Hypoglycemic episodes occurred N/A  Overnight periods: Optimal    DIABETIC COMPLICATIONS: Microvascular complications:  Neuropathy, DR stable B/L  Denies: CKD  Last Eye Exam:  scheduled 04/17/2023  Macrovascular complications:   Denies: CAD, CVA, PVD   HISTORY:  Past Medical History:  Past Medical History:  Diagnosis Date   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   COVID-19 12/2020   Headache    hx migraines none in years   Heart murmur, systolic    Negative echocardiogram 2016   Hyperlipidemia    on meds   Hypertension    on meds   Obesity    Prolapse of 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    Umbilical hernia    Uncontrolled type 2 diabetes mellitus with hyperglycemia, with long-term current use of insulin  (HCC) 11/29/2019   on meds   Past Surgical History:  Past Surgical History:  Procedure Laterality Date   ANTERIOR AND POSTERIOR REPAIR WITH SACROSPINOUS FIXATION N/A 01/21/2021   Procedure: SACROSPINOUS FIXATION;  Surgeon: Marilynne Rosaline SAILOR, MD;  Location: [facility];  Service: Gynecology;  Laterality: N/A;   CHOLECYSTECTOMY     COLONOSCOPY  10/2020   CG-MAC-fair  prep + ulcer tics, low sphincter tone   COLONOSCOPY WITH PROPOFOL  N/A 05/31/2016   Procedure: COLONOSCOPY WITH PROPOFOL ;  Surgeon: Gladis MARLA Louder, MD;  Location: [facility];  Service: Endoscopy;  Laterality: N/A;   FLEXIBLE SIGMOIDOSCOPY N/A 03/26/2021   Procedure: FLEXIBLE SIGMOIDOSCOPY;  Surgeon: Leigh Elspeth SQUIBB, MD;  Location: [facility];  Service: Gastroenterology;  Laterality: N/A;   HEMOSTASIS CLIP PLACEMENT  03/26/2021   Procedure: HEMOSTASIS CLIP PLACEMENT;  Surgeon:  Leigh Elspeth SQUIBB, MD;  Location: [facility];  Service: Gastroenterology;;   HOT HEMOSTASIS N/A 03/26/2021   Procedure: HOT HEMOSTASIS (ARGON PLASMA COAGULATION/BICAP);  Surgeon: Leigh Elspeth SQUIBB, MD;  Location: [facility];  Service: Gastroenterology;  Laterality: N/A;   RECTOCELE REPAIR N/A 01/21/2021   Procedure: POSTERIOR REPAIR (RECTOCELE) and perineorrhaphy;  Surgeon: Marilynne Rosaline SAILOR, MD;  Location: [facility];  Service: Gynecology;  Laterality: N/A;   Social History:  reports that she has never smoked. She has never used smokeless tobacco. She reports that she does not currently use alcohol. She reports that she does not use drugs. Family History:  Family History  Adopted: Yes  Problem Relation Age of Onset   Breast cancer Neg Hx      HOME MEDICATIONS: Allergies as of 10/16/2023       Reactions   Latex Other (See Comments)   Taste sensation         Medication List        Accurate as of October 16, 2023  9:02 AM. If you have any questions, ask your nurse or doctor.          STOP taking these medications    tirzepatide  7.5 MG/0.5ML Pen Commonly known as: MOUNJARO  Replaced by: tirzepatide  10 MG/0.5ML Pen Stopped by: Donell PARAS Aleiah Mohammed       TAKE these medications    Accu-Chek Guide Me w/Device Kit by Does not apply route.   Accu-Chek Guide test strip Generic drug: glucose blood CHECK BLOOD SUGARS 3 TIMES DAILY   aspirin 81 MG tablet Take 81 mg by mouth daily.   clotrimazole 1 % cream Commonly known as: LOTRIMIN 1 application Externally Twice a day as needed for 14 days   dapagliflozin  propanediol 10 MG Tabs tablet Commonly known as: Farxiga  Take 1 tablet (10 mg total) by mouth daily.   Dexcom G7 Sensor Misc 1 Device by Does not apply route as directed.   docusate sodium  100 MG capsule Commonly known as: Colace Take 1 capsule (100 mg total) by mouth 2 (two) times daily.   doxycycline 100 MG tablet Commonly known  as: ADOXA 1 tablet Orally q 12 for 10 days   estradiol  0.1 MG/GM vaginal cream Commonly known as: ESTRACE  PLACE 0.5G NIGHTLY FOR TWO WEEKS THEN TWICE A WEEK AFTER   fenofibrate 160 MG tablet Take 160 mg by mouth daily.   Imiquimod Pump 3.75 % Crea Apply topically.   Insulin  Pen Needle 32G X 4 MM Misc 1 Device by Does not apply route in the morning, at noon, in the evening, and at bedtime.   lisinopril-hydrochlorothiazide 20-25 MG tablet Commonly known as: ZESTORETIC Take 1 tablet by mouth daily.   MELATONIN PO Take 1 tablet by mouth at bedtime.   mesalamine  1000 MG suppository Commonly known as: CANASA  Place 1 suppository (1,000 mg total) rectally at bedtime.   metFORMIN  1000 MG tablet Commonly known as: GLUCOPHAGE  TAKE 1 TABLET (1,000 MG TOTAL) BY MOUTH TWICE A DAY WITH FOOD   NovoLOG  FlexPen 100 UNIT/ML  FlexPen Generic drug: insulin  aspart Max daily 60 units   polyethylene glycol powder 17 GM/SCOOP powder Commonly known as: GLYCOLAX /MIRALAX  Take 17 g by mouth daily. Drink 17g (1 scoop) dissolved in water per day.   rosuvastatin  10 MG tablet Commonly known as: Crestor  Take 1 tablet (10 mg total) by mouth daily.   tirzepatide  10 MG/0.5ML Pen Commonly known as: MOUNJARO  Inject 10 mg into the skin once a week. Replaces: tirzepatide  7.5 MG/0.5ML Pen Started by: Donell PARAS Karis Rilling   Tresiba  FlexTouch 100 UNIT/ML FlexTouch Pen Generic drug: insulin  degludec INJECT 26 UNITS INTO THE SKIN DAILY.         OBJECTIVE:   Vital Signs: BP 134/70 (BP Location: Left Arm, Patient Position: Sitting, Cuff Size: Normal)   Pulse 98   Ht 5' 10 (1.778 m)   Wt 241 lb (109.3 kg)   LMP  (LMP Unknown)   SpO2 97%   BMI 34.58 kg/m   Wt Readings from Last 3 Encounters:  10/16/23 241 lb (109.3 kg)  05/30/23 235 lb (106.6 kg)  01/30/23 230 lb (104.3 kg)     Exam: General: Pt appears well and is in NAD  Lungs: Clear with good BS bilat   Heart: RRR   Extremities: Trace  pretibial edema  Neuro: MS is good with appropriate affect, pt is alert and Ox3    DM foot exam: 05/30/2023  The skin of the feet is intact without sores or ulcerations. The pedal pulses are 1+ on right and 1+ on left. The sensation is intact to a screening 5.07, 10 gram monofilament bilaterally          DATA REVIEWED:  Lab Results  Component Value Date   HGBA1C 7.5 (A) 10/16/2023   HGBA1C 7.1 (A) 05/30/2023   HGBA1C 7.2 (A) 01/30/2023   Labs through [facility] 03/01/2023 BUN 19 Creatinine 0.74 GFR 101 HDL 29 LDL 60 Triglycerides 144  ASSESSMENT / PLAN / RECOMMENDATIONS:   1) Type 2 Diabetes Mellitus, Optimally controlled, With retinopathic and neuropathic  complications - Most recent A1c of 7.5 %. Goal A1c < 7.0 %.     -A1c has trended up with weight gain, patient admits to dietary indiscretions due to stress -I had switched  Rybelsus  to Mounjaro  due to lack of clinical outcome  -We have entertained switching to Ozempic since there is no clinical improvement with Mounjaro  - Will increase Mounjaro     MEDICATIONS:  -Continue Metformin  1000 mg Twice daily  -Continue Farxiga  10 mg daily -Increase Mounjaro  10 mg weekly - Continue Tresiba  28 units daily  - Continue Novolog  10 units with each meal  - Continue CF: NovoLog  (BG -130/25) TIDQAC    EDUCATION / INSTRUCTIONS: BG monitoring instructions: Patient is instructed to check her blood sugars 3 times a day, before meals. Call [place] [facility] if: BG persistently < 70  I reviewed the Rule of 15 for the treatment of hypoglycemia in detail with the patient. Literature supplied.   2) Diabetic complications:  Eye: Does  have known diabetic retinopathy.  Neuro/ Feet: Does not have known diabetic peripheral neuropathy .  Renal: Patient does not have known baseline CKD. She   is on an ACEI/ARB at present.     3) Dyslipidemia:  -TG, LDL are at goal -I have increased rosuvastatin   07/2022  Medication  Continue rosuvastatin  10 mg daily   F/U in 4 months   Signed electronically by: Stefano Redgie Butts, MD  [facility]  [facility] [address] [place], [place] [address] Phone: [phone] FAX: [phone]   CC: Elliot Charm, MD [address] [place] [address] Phone: [phone]  Fax: [phone]  Return to Endocrinology clinic as below: Future Appointments  Date Time Provider Department Center  01/09/2024 12:30 PM [facility]

## 2023-10-16 NOTE — Patient Instructions (Signed)
-   Increase  Mounjaro  10 mg, ONCE weekly  - Continue Metformin  1000 mg Twice daily  - Continue Farxiga  10 mg daily  - Continue Tresiba   28 units daily  - Continue Novolog  10 units with each meal  -Novolog  correctional insulin : ADD extra units on insulin  to your meal-time Novolog  dose if your blood sugars are higher than 155. Use the scale below to help guide you:   Blood sugar before meal Number of units to inject  Less than 155 0 unit  156 -  180 1 units  181 -  205 2 units  206 -  230 3 units  231 -  255 4 units  256 -  280 5 units  281 -  305 6 units  306 -  330 7 units  331 -  355 8 units     HOW TO TREAT LOW BLOOD SUGARS (Blood sugar LESS THAN 70 MG/DL) Please follow the RULE OF 15 for the treatment of hypoglycemia treatment (when your (blood sugars are less than 70 mg/dL)   STEP 1: Take 15 grams of carbohydrates when your blood sugar is low, which includes:  3-4 GLUCOSE TABS  OR 3-4 OZ OF JUICE OR REGULAR SODA OR ONE TUBE OF GLUCOSE GEL    STEP 2: RECHECK blood sugar in 15 MINUTES STEP 3: If your blood sugar is still low at the 15 minute recheck --> then, go back to STEP 1 and treat AGAIN with another 15 grams of carbohydrates.

## 2023-10-19 ENCOUNTER — Other Ambulatory Visit: Payer: Self-pay | Admitting: Internal Medicine

## 2023-10-19 DIAGNOSIS — E1142 Type 2 diabetes mellitus with diabetic polyneuropathy: Secondary | ICD-10-CM

## 2023-10-27 ENCOUNTER — Other Ambulatory Visit: Payer: BC Managed Care – PPO

## 2023-12-12 ENCOUNTER — Other Ambulatory Visit: Payer: Self-pay | Admitting: Internal Medicine

## 2023-12-12 DIAGNOSIS — Z0000 Encounter for general adult medical examination without abnormal findings: Secondary | ICD-10-CM

## 2023-12-21 ENCOUNTER — Ambulatory Visit
Admission: RE | Admit: 2023-12-21 | Discharge: 2023-12-21 | Disposition: A | Source: Ambulatory Visit | Attending: Internal Medicine | Admitting: Internal Medicine

## 2023-12-21 DIAGNOSIS — Z0000 Encounter for general adult medical examination without abnormal findings: Secondary | ICD-10-CM

## 2024-01-09 ENCOUNTER — Ambulatory Visit (HOSPITAL_BASED_OUTPATIENT_CLINIC_OR_DEPARTMENT_OTHER)
Admission: RE | Admit: 2024-01-09 | Discharge: 2024-01-09 | Disposition: A | Discharge status: Home / Self Care | Source: Ambulatory Visit | Attending: Internal Medicine | Admitting: Internal Medicine

## 2024-01-09 DIAGNOSIS — E2839 Other primary ovarian failure: Secondary | ICD-10-CM | POA: Insufficient documentation

## 2024-02-20 ENCOUNTER — Other Ambulatory Visit

## 2024-02-20 ENCOUNTER — Encounter: Payer: Self-pay | Admitting: Internal Medicine

## 2024-02-20 ENCOUNTER — Ambulatory Visit: Admitting: Internal Medicine

## 2024-02-20 VITALS — BP 154/80 | Ht 70.0 in | Wt 242.0 lb

## 2024-02-20 DIAGNOSIS — E785 Hyperlipidemia, unspecified: Secondary | ICD-10-CM | POA: Diagnosis not present

## 2024-02-20 DIAGNOSIS — E1129 Type 2 diabetes mellitus with other diabetic kidney complication: Secondary | ICD-10-CM

## 2024-02-20 DIAGNOSIS — E1165 Type 2 diabetes mellitus with hyperglycemia: Secondary | ICD-10-CM

## 2024-02-20 DIAGNOSIS — E1142 Type 2 diabetes mellitus with diabetic polyneuropathy: Secondary | ICD-10-CM

## 2024-02-20 DIAGNOSIS — Z794 Long term (current) use of insulin: Secondary | ICD-10-CM

## 2024-02-20 DIAGNOSIS — R809 Proteinuria, unspecified: Secondary | ICD-10-CM

## 2024-02-20 LAB — POCT GLYCOSYLATED HEMOGLOBIN (HGB A1C): Hemoglobin A1C: 7.5 % — AB (ref 4.0–5.6)

## 2024-02-20 MED ORDER — TRESIBA FLEXTOUCH 100 UNIT/ML ~~LOC~~ SOPN: 25.0000 [IU] | PEN_INJECTOR | Freq: Every day | SUBCUTANEOUS | 3 refills | Status: AC

## 2024-02-20 MED ORDER — INSULIN PEN NEEDLE 32G X 4 MM MISC: 1.0000 | Freq: Four times a day (QID) | 3 refills | Status: AC

## 2024-02-20 MED ORDER — NOVOLOG FLEXPEN 100 UNIT/ML ~~LOC~~ SOPN: PEN_INJECTOR | SUBCUTANEOUS | 3 refills | Status: AC

## 2024-02-20 MED ORDER — DEXCOM G7 SENSOR MISC: 1.0000 | 3 refills | Status: AC

## 2024-02-20 MED ORDER — TIRZEPATIDE 12.5 MG/0.5ML ~~LOC~~ SOAJ: 12.5000 mg | SUBCUTANEOUS | 3 refills | Status: AC

## 2024-02-20 MED ORDER — ROSUVASTATIN CALCIUM 10 MG PO TABS: 10.0000 mg | ORAL_TABLET | Freq: Every day | ORAL | 3 refills | Status: AC

## 2024-02-20 MED ORDER — METFORMIN HCL 1000 MG PO TABS: 1000.0000 mg | ORAL_TABLET | Freq: Two times a day (BID) | ORAL | 3 refills | Status: AC

## 2024-02-20 MED ORDER — DAPAGLIFLOZIN PROPANEDIOL 10 MG PO TABS: 10.0000 mg | ORAL_TABLET | Freq: Every day | ORAL | 1 refills | Status: AC

## 2024-02-20 NOTE — Patient Instructions (Addendum)
-   Increase  Mounjaro  12.5 mg, ONCE weekly  - Continue Metformin  1000 mg Twice daily  - Continue Farxiga  10 mg daily  - Tresiba  Tresiba   25 units daily  - Continue Novolog  12 units with each meal  -Novolog  correctional insulin : ADD extra units on insulin  to your meal-time Novolog  dose if your blood sugars are higher than 155. Use the scale below to help guide you:   Blood sugar before meal Number of units to inject  Less than 155 0 unit  156 -  180 1 units  181 -  205 2 units  206 -  230 3 units  231 -  255 4 units  256 -  280 5 units  281 -  305 6 units  306 -  330 7 units  331 -  355 8 units     HOW TO TREAT LOW BLOOD SUGARS (Blood sugar LESS THAN 70 MG/DL) Please follow the RULE OF 15 for the treatment of hypoglycemia treatment (when your (blood sugars are less than 70 mg/dL)   STEP 1: Take 15 grams of carbohydrates when your blood sugar is low, which includes:  3-4 GLUCOSE TABS  OR 3-4 OZ OF JUICE OR REGULAR SODA OR ONE TUBE OF GLUCOSE GEL    STEP 2: RECHECK blood sugar in 15 MINUTES STEP 3: If your blood sugar is still low at the 15 minute recheck --> then, go back to STEP 1 and treat AGAIN with another 15 grams of carbohydrates.

## 2024-02-20 NOTE — Progress Notes (Unsigned)
 Name: Kristine Callahan  Age/ Sex: 58 y.o., female   MRN/ DOB: [id number], [birth year]     PCP: Elliot Charm, MD   Reason for Endocrinology Evaluation: Type 2 Diabetes Mellitus  Initial Endocrine Consultative Visit: T2DM and HTN    PATIENT IDENTIFIER: Kristine Callahan is a 58 y.o. female with a past medical history of HTN, T2 DM and ulcerative proctitis. The patient has followed with Endocrinology clinic since 11/29/2019 for consultative assistance with management of her diabetes.  DIABETIC HISTORY:  Kristine Callahan was diagnosed with DM yrs ago, she has been on Glipizide, insulin  started 08/2019. Her hemoglobin A1c has ranged from 7.1% , peaking at 11.0%  in 2016.  On her initial visit to our clinic she had an A1c of 7.9% .  She was on Janumet, Farxiga , Victoza  and basal insulin . We stopped Janumet , started Metformin , switched Levemir tresiba  and continued Farxiga  and Victoza .   She was lost to follow-up until her return and 11/2020 with an A1c of 8.3%   By July 2023 we switched Victoza  to Rybelsus   Switch Rybelsus  to Mounjaro  01/2023 with an A1c of 7.2%  SUBJECTIVE:   During the last visit (10/16/2023): A1c 7.5%   Today (02/20/2024): Kristine Callahan is here for a follow up on diabetes management.She checks her glucose multiple times a day,  through dexcom.  She has been noted with rare hypoglycemia.  Weight remains stable, despite increasing Mounjaro   No nausea or vomiting  Continues with constipation, takes colace 4x daily     HOME DIABETES REGIMEN:  Metformin  1000 mg Twice daily  Farxiga  10 mg daily  Mounjaro  10 mg weekly Tresiba  28 units daily Novolog  10 units with each meal CF: NovoLog  (BG -130/25) Rosuvastatin  10 mg daily     Statin: yes ACE-I/ARB: yes Prior Diabetic Education: yes   CONTINUOUS GLUCOSE MONITORING RECORD INTERPRETATION    Dates of Recording: 11/19-12/04/2023  Sensor description:dexcom  Results statistics:   CGM use % of time 94  Average  and SD 163/36  Time in range 69%  % Time Above 180 29  % Time above 250 2  % Time Below target 0    Glycemic patterns summary: BGs are optimal overnight and fluctuate throughout the day  Hyperglycemic episodes  postprandial  Hypoglycemic episodes occurred N/A  Overnight periods: Optimal    DIABETIC COMPLICATIONS: Microvascular complications:  Neuropathy, DR stable B/L  Denies: CKD  Last Eye Exam:  scheduled 04/17/2023  Macrovascular complications:   Denies: CAD, CVA, PVD   HISTORY:  Past Medical History:  Past Medical History:  Diagnosis Date   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   COVID-19 12/2020   Headache    hx migraines none in years   Heart murmur, systolic    Negative echocardiogram 2016   Hyperlipidemia    on meds   Hypertension    on meds   Obesity    Prolapse of 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    Umbilical hernia    Uncontrolled type 2 diabetes mellitus with hyperglycemia, with long-term current use of insulin  (HCC) 11/29/2019   on meds   Past Surgical History:  Past Surgical History:  Procedure Laterality Date   ANTERIOR AND POSTERIOR REPAIR WITH SACROSPINOUS FIXATION N/A 01/21/2021   Procedure: SACROSPINOUS FIXATION;  Surgeon: Marilynne Rosaline SAILOR, MD;  Location: [facility];  Service: Gynecology;  Laterality: N/A;   CHOLECYSTECTOMY     COLONOSCOPY  10/2020   CG-MAC-fair prep + ulcer tics, low sphincter tone  COLONOSCOPY WITH PROPOFOL  N/A 05/31/2016   Procedure: COLONOSCOPY WITH PROPOFOL ;  Surgeon: Gladis MARLA Louder, MD;  Location: [facility];  Service: Endoscopy;  Laterality: N/A;   FLEXIBLE SIGMOIDOSCOPY N/A 03/26/2021   Procedure: FLEXIBLE SIGMOIDOSCOPY;  Surgeon: Leigh Elspeth SQUIBB, MD;  Location: [facility];  Service: Gastroenterology;  Laterality: N/A;   HEMOSTASIS CLIP PLACEMENT  03/26/2021   Procedure: HEMOSTASIS CLIP PLACEMENT;  Surgeon: Leigh Elspeth SQUIBB, MD;  Location: [facility];  Service: Gastroenterology;;    HOT HEMOSTASIS N/A 03/26/2021   Procedure: HOT HEMOSTASIS (ARGON PLASMA COAGULATION/BICAP);  Surgeon: Leigh Elspeth SQUIBB, MD;  Location: [facility];  Service: Gastroenterology;  Laterality: N/A;   RECTOCELE REPAIR N/A 01/21/2021   Procedure: POSTERIOR REPAIR (RECTOCELE) and perineorrhaphy;  Surgeon: Marilynne Rosaline SAILOR, MD;  Location: [facility];  Service: Gynecology;  Laterality: N/A;   Social History:  reports that she has never smoked. She has never used smokeless tobacco. She reports that she does not currently use alcohol. She reports that she does not use drugs. Family History:  Family History  Adopted: Yes  Problem Relation Age of Onset   Breast cancer Neg Hx      HOME MEDICATIONS: Allergies as of 02/20/2024       Reactions   Latex Other (See Comments)   Taste sensation         Medication List        Accurate as of February 20, 2024  8:59 AM. If you have any questions, ask your nurse or doctor.          Accu-Chek Guide Me w/Device Kit by Does not apply route.   Accu-Chek Guide test strip Generic drug: glucose blood CHECK BLOOD SUGARS 3 TIMES DAILY   aspirin 81 MG tablet Take 81 mg by mouth daily.   clotrimazole 1 % cream Commonly known as: LOTRIMIN 1 application Externally Twice a day as needed for 14 days   Dexcom G7 Sensor Misc 1 Device by Does not apply route as directed.   docusate sodium  100 MG capsule Commonly known as: Colace Take 1 capsule (100 mg total) by mouth 2 (two) times daily.   doxycycline 100 MG tablet Commonly known as: ADOXA 1 tablet Orally q 12 for 10 days   estradiol  0.1 MG/GM vaginal cream Commonly known as: ESTRACE  PLACE 0.5G NIGHTLY FOR TWO WEEKS THEN TWICE A WEEK AFTER   Farxiga  10 MG Tabs tablet Generic drug: dapagliflozin  propanediol TAKE 1 TABLET BY MOUTH EVERY DAY   fenofibrate 160 MG tablet Take 160 mg by mouth daily.   Imiquimod Pump 3.75 % Crea Apply topically.   Insulin  Pen Needle 32G X  4 MM Misc 1 Device by Does not apply route in the morning, at noon, in the evening, and at bedtime.   lisinopril-hydrochlorothiazide 20-25 MG tablet Commonly known as: ZESTORETIC Take 1 tablet by mouth daily.   MELATONIN PO Take 1 tablet by mouth at bedtime.   mesalamine  1000 MG suppository Commonly known as: CANASA  Place 1 suppository (1,000 mg total) rectally at bedtime.   metFORMIN  1000 MG tablet Commonly known as: GLUCOPHAGE  TAKE 1 TABLET (1,000 MG TOTAL) BY MOUTH TWICE A DAY WITH FOOD   NovoLOG  FlexPen 100 UNIT/ML FlexPen Generic drug: insulin  aspart Max daily 60 units   polyethylene glycol powder 17 GM/SCOOP powder Commonly known as: GLYCOLAX /MIRALAX  Take 17 g by mouth daily. Drink 17g (1 scoop) dissolved in water per day.   rosuvastatin  10 MG tablet Commonly known as: Crestor  Take 1 tablet (10  mg total) by mouth daily.   tirzepatide  10 MG/0.5ML Pen Commonly known as: MOUNJARO  Inject 10 mg into the skin once a week.   Tresiba  FlexTouch 100 UNIT/ML FlexTouch Pen Generic drug: insulin  degludec INJECT 26 UNITS INTO THE SKIN DAILY.         OBJECTIVE:   Vital Signs: BP (!) 154/80   Ht 5' 10 (1.778 m)   Wt 242 lb (109.8 kg)   LMP  (LMP Unknown)   BMI 34.72 kg/m   Wt Readings from Last 3 Encounters:  02/20/24 242 lb (109.8 kg)  10/16/23 241 lb (109.3 kg)  05/30/23 235 lb (106.6 kg)     Exam: General: Pt appears well and is in NAD  Lungs: Clear with good BS bilat   Heart: RRR   Extremities: Trace pretibial edema  Neuro: MS is good with appropriate affect, pt is alert and Ox3    DM foot exam: 02/20/2024  The skin of the feet is intact without sores or ulcerations. The pedal pulses are 1+ on right and 1+ on left. The sensation is intact to a screening 5.07, 10 gram monofilament bilaterally          DATA REVIEWED:  Lab Results  Component Value Date   HGBA1C 7.5 (A) 10/16/2023   HGBA1C 7.1 (A) 05/30/2023   HGBA1C 7.2 (A) 01/30/2023   Labs  through [facility] 03/01/2023 BUN 19 Creatinine 0.74 GFR 101 HDL 29 LDL 60 Triglycerides 144  ASSESSMENT / PLAN / RECOMMENDATIONS:   1) Type 2 Diabetes Mellitus, Optimally controlled, With retinopathic and neuropathic  complications - Most recent A1c of 7.5 %. Goal A1c < 7.0 %.     -A1c remains above goal -I had switched  Rybelsus  to Mounjaro  due to lack of clinical outcome  - In reviewing CGM data, the patient has been noted with tight BG's and at times hypoglycemia overnight, will decrease her basal insulin  as below - Patient continues with postprandial hyperglycemia, I will increase her basal insulin  as below - I will also increase her Mounjaro   MEDICATIONS:  -Continue Metformin  1000 mg Twice daily  -Continue Farxiga  10 mg daily -Increase Mounjaro  12.5 mg weekly - Decrease Tresiba  25 units daily  - Increase Novolog  12 units with each meal  - Continue CF: NovoLog  (BG -130/25) TIDQAC    EDUCATION / INSTRUCTIONS: BG monitoring instructions: Patient is instructed to check her blood sugars 3 times a day, before meals. Call [facility] if: BG persistently < 70  I reviewed the Rule of 15 for the treatment of hypoglycemia in detail with the patient. Literature supplied.   2) Diabetic complications:  Eye: Does  have known diabetic retinopathy.  Neuro/ Feet: Does not have known diabetic peripheral neuropathy .  Renal: Patient does not have known baseline CKD. She   is on an ACEI/ARB at present.     3) Dyslipidemia:   -I had increased rosuvastatin  07/2022 - Lipid panel****  Medication  Continue rosuvastatin  10 mg daily   F/U in 6 months   Signed electronically by: Stefano Redgie Butts, MD  [facility]  [facility] [address] [place] [place], [place] [address] Phone: [phone] FAX: [phone]   CC: Elliot Charm, MD [address] [place] [address] Phone: [phone]  Fax:  [phone]  Return to Endocrinology clinic as below: Future Appointments  Date Time Provider Department Center  02/20/2024  9:10 AM Anthea Udovich, Donell Redgie, MD [facility] None

## 2024-02-21 ENCOUNTER — Ambulatory Visit: Payer: Self-pay | Admitting: Internal Medicine

## 2024-02-21 DIAGNOSIS — R809 Proteinuria, unspecified: Secondary | ICD-10-CM | POA: Insufficient documentation

## 2024-02-21 LAB — MICROALBUMIN / CREATININE URINE RATIO
Creatinine, Urine: 70 mg/dL (ref 20–275)
Microalb Creat Ratio: 37 mg/g{creat} — ABNORMAL HIGH (ref <30)
Microalb, Ur: 2.6 mg/dL

## 2024-02-21 LAB — LIPID PANEL
Cholesterol: 115 mg/dL (ref <200)
HDL: 30 mg/dL — ABNORMAL LOW (ref >=50)
LDL Cholesterol (Calc): 55 mg/dL
Non-HDL Cholesterol (Calc): 85 mg/dL (ref <130)
Total CHOL/HDL Ratio: 3.8 (calc) (ref <5.0)
Triglycerides: 240 mg/dL — ABNORMAL HIGH (ref <150)

## 2024-02-21 LAB — BASIC METABOLIC PANEL WITH GFR
BUN: 21 mg/dL (ref 7–25)
CO2: 24 mmol/L (ref 20–32)
Calcium: 9.5 mg/dL (ref 8.6–10.4)
Chloride: 103 mmol/L (ref 98–110)
Creat: 0.71 mg/dL (ref 0.50–1.03)
Glucose, Bld: 168 mg/dL — ABNORMAL HIGH (ref 65–99)
Potassium: 4.2 mmol/L (ref 3.5–5.3)
Sodium: 137 mmol/L (ref 135–146)
eGFR: 98 mL/min/1.73m2 (ref >=60)

## 2024-08-20 ENCOUNTER — Ambulatory Visit: Admitting: Internal Medicine
